# Patient Record
Sex: Female | Born: 1965 | Race: White | Hispanic: No | Marital: Married | Smoking: Former smoker
Health system: Southern US, Community
[De-identification: ages and names within clinical notes are randomized; demographics above are authoritative.]

## PROBLEM LIST (undated history)

## (undated) DIAGNOSIS — F32A Depression, unspecified: Secondary | ICD-10-CM

## (undated) DIAGNOSIS — I87301 Chronic venous hypertension (idiopathic) without complications of right lower extremity: Secondary | ICD-10-CM

## (undated) DIAGNOSIS — M069 Rheumatoid arthritis, unspecified: Secondary | ICD-10-CM

## (undated) DIAGNOSIS — R079 Chest pain, unspecified: Secondary | ICD-10-CM

## (undated) DIAGNOSIS — K5900 Constipation, unspecified: Secondary | ICD-10-CM

## (undated) DIAGNOSIS — F329 Major depressive disorder, single episode, unspecified: Secondary | ICD-10-CM

## (undated) DIAGNOSIS — I83891 Varicose veins of right lower extremities with other complications: Secondary | ICD-10-CM

## (undated) DIAGNOSIS — M549 Dorsalgia, unspecified: Secondary | ICD-10-CM

## (undated) DIAGNOSIS — K219 Gastro-esophageal reflux disease without esophagitis: Secondary | ICD-10-CM

## (undated) DIAGNOSIS — G5600 Carpal tunnel syndrome, unspecified upper limb: Secondary | ICD-10-CM

## (undated) DIAGNOSIS — I8390 Asymptomatic varicose veins of unspecified lower extremity: Secondary | ICD-10-CM

## (undated) DIAGNOSIS — Z8701 Personal history of pneumonia (recurrent): Secondary | ICD-10-CM

## (undated) DIAGNOSIS — M1990 Unspecified osteoarthritis, unspecified site: Secondary | ICD-10-CM

## (undated) DIAGNOSIS — R7303 Prediabetes: Secondary | ICD-10-CM

## (undated) DIAGNOSIS — R600 Localized edema: Secondary | ICD-10-CM

## (undated) DIAGNOSIS — M51369 Other intervertebral disc degeneration, lumbar region without mention of lumbar back pain or lower extremity pain: Secondary | ICD-10-CM

## (undated) DIAGNOSIS — E559 Vitamin D deficiency, unspecified: Secondary | ICD-10-CM

## (undated) DIAGNOSIS — J189 Pneumonia, unspecified organism: Secondary | ICD-10-CM

## (undated) DIAGNOSIS — K829 Disease of gallbladder, unspecified: Secondary | ICD-10-CM

## (undated) DIAGNOSIS — F419 Anxiety disorder, unspecified: Secondary | ICD-10-CM

## (undated) DIAGNOSIS — R0602 Shortness of breath: Secondary | ICD-10-CM

## (undated) DIAGNOSIS — M2550 Pain in unspecified joint: Secondary | ICD-10-CM

## (undated) HISTORY — DX: Personal history of pneumonia (recurrent): Z87.01

## (undated) HISTORY — DX: Rheumatoid arthritis, unspecified: M06.9

## (undated) HISTORY — DX: Dorsalgia, unspecified: M54.9

## (undated) HISTORY — DX: Unspecified osteoarthritis, unspecified site: M19.90

## (undated) HISTORY — DX: Chest pain, unspecified: R07.9

## (undated) HISTORY — DX: Localized edema: R60.0

## (undated) HISTORY — DX: Shortness of breath: R06.02

## (undated) HISTORY — DX: Asymptomatic varicose veins of unspecified lower extremity: I83.90

## (undated) HISTORY — PX: OTHER SURGICAL HISTORY: SHX169

## (undated) HISTORY — PX: CARPAL TUNNEL RELEASE: SHX101

## (undated) HISTORY — PX: DILATION AND CURETTAGE OF UTERUS: SHX78

## (undated) HISTORY — DX: Prediabetes: R73.03

## (undated) HISTORY — DX: Other intervertebral disc degeneration, lumbar region without mention of lumbar back pain or lower extremity pain: M51.369

## (undated) HISTORY — PX: CHOLECYSTECTOMY: SHX55

## (undated) HISTORY — PX: JOINT REPLACEMENT: SHX530

## (undated) HISTORY — DX: Vitamin D deficiency, unspecified: E55.9

## (undated) HISTORY — DX: Disease of gallbladder, unspecified: K82.9

## (undated) HISTORY — DX: Pain in unspecified joint: M25.50

---

## 2006-10-20 ENCOUNTER — Ambulatory Visit (HOSPITAL_COMMUNITY): Admission: RE | Admit: 2006-10-20 | Discharge: 2006-10-20 | Payer: Self-pay | Admitting: Family Medicine

## 2007-10-13 ENCOUNTER — Ambulatory Visit: Payer: Self-pay | Admitting: Vascular Surgery

## 2008-01-19 ENCOUNTER — Ambulatory Visit: Payer: Self-pay | Admitting: Vascular Surgery

## 2008-02-09 ENCOUNTER — Ambulatory Visit: Payer: Self-pay | Admitting: Vascular Surgery

## 2008-02-16 ENCOUNTER — Ambulatory Visit: Payer: Self-pay | Admitting: Vascular Surgery

## 2008-03-31 ENCOUNTER — Ambulatory Visit: Payer: Self-pay | Admitting: Vascular Surgery

## 2008-10-18 ENCOUNTER — Ambulatory Visit: Payer: Self-pay | Admitting: Vascular Surgery

## 2010-07-28 ENCOUNTER — Encounter: Payer: Self-pay | Admitting: Internal Medicine

## 2010-11-19 NOTE — Assessment & Plan Note (Signed)
OFFICE VISIT   Elizabeth Mcclure, Elizabeth Mcclure  DOB:  March 06, 1966                                       10/18/2008  [id number]#:[id number]   The patient presents today for continued discussion regarding leg pain.  She is status post attempted ablation of an incompetent large great  saphenous vein by myself on 02/09/2008.  She had a failed attempt at  ablation in 2004 in [place].  She does have discomfort on both  lower extremities.  This is more so on the right than on the left.  She  reports she senses total leg swelling more so in the right than on the  left.  She has several components of pain.  These are pain in her groin  area and also she reports a sensation of fullness and discomfort in her  posterior thigh especially with sitting.  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   She underwent handheld duplex by myself today and this shows continued  patency of her incompetent right great saphenous vein.  I do not see any  evidence of varicosities in her groin where she is having the pain.  I  had a long discussion with the patient.  I explained that there are some  components of her discomfort that I feel that we could treat with the  correction of her venous reflux in her right great saphenous vein but  that the area of most concern in her groin is not consistent with vein  problems that she is having.  I also explained that some of her swelling  may be improved by ablation.  She is morbidly obese and is considering  gastric banding for weight loss assistance, do feel that this will make  things better.  She reports that she has had prior arthroscopy in her  left knee and is now having new problems and pain in her right knee.  I  explained that this would not be related to the venous pathology she  has.  Since she certainly has multiple components of her pain I am not  sure that we would give her a great amount of benefit  with treatment.  I  did explain that if she wished to proceed with treatment of her  saphenous reflux that I would recommend surgical stripping of her great  saphenous vein.  She has had failure of two attempts with ablation, one  by another practitioner in 2004 and one by myself in 2009.  She is upset  regarding this and somewhat tearful today and reports that she will  continue to consider her options.  She will notify us should she wish to  proceed with any further consideration of surgery.   Larina Earthly, M.D.  Electronically Signed   TFE/MEDQ  D:  10/18/2008  T:  10/19/2008  Job:  [id number]   cc:   Kirstie Peri, MD

## 2010-11-19 NOTE — Assessment & Plan Note (Signed)
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  She has worn compression garments  for 3 months now and continues to have severe difficulty related to  this.  She reports that she continues to have pain with prolonged  standing, makes it extremely difficult secondary to leg pain and  swelling.  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  She  is also unable to bend and squat secondary to leg pain and swelling to  do housework, yard work and childcare.  She is not able to exercise  secondary to leg pain and previously walked and swam regularly as  exercise regimen which she can no longer do.  She has clearly failed  conservative treatment.  She underwent a formal venous Duplex today and  this does reveal reflux throughout her saphenous vein from her  saphenofemoral junction down to below her knee.  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  I have recommended that we proceed with laser  ablation and stab phlebectomy for correction of her venous hypertension.  I explained the procedure is done as an outpatient.  This is in our  office, she is ambulatory immediately following the procedure.  She  understands and wishes to proceed as soon as we can assure insurance  approval for her.  She would like to accomplish this so she can return  to work at the start of school.   Larina Earthly, M.D.  Electronically Signed   TFE/MEDQ  D:  01/19/2008  T:  01/20/2008  Job:  [id number]   cc:   Kirstie Peri, MD

## 2010-11-19 NOTE — Procedures (Signed)
DUPLEX DEEP VENOUS EXAM - LOWER EXTREMITY   INDICATION:  Follow-up evaluation of right greater saphenous vein  ablation.   HISTORY:  Edema:  Yes.  Trauma/Surgery:  Right greater saphenous vein ablation on 02/09/08.  Pain:  Yes.  PE:  No.  Previous DVT:  No.  Anticoagulants:  No.  Other:   DUPLEX EXAM:                CFV   SFV   PopV  PTV    GSV                R  L  R  L  R  L  R   L  R  L  Thrombosis    o                        P  Spontaneous   +                        +  Phasic        +                        +  Augmentation  +                        D  Compressible  +                        P  Competent     0                        0   Legend:  + - yes  o - no  p - partial  D - decreased    IMPRESSION:  1. Limited deep venous thrombosis study due to patient's body habitus.  2. Reflux is noted in the common femoral vein and in the greater      saphenous vein.  3. The greater saphenous vein is fully compressible at the      saphenofemoral junction and just distally and is partially      thrombosed just distally at the level of the proximal thigh.       _____________________________  Larina Earthly, M.D.   PB/MEDQ  D:  02/16/2008  T:  02/16/2008  Job:  [id number]

## 2010-11-19 NOTE — Assessment & Plan Note (Signed)
OFFICE VISIT   Elizabeth Mcclure, Elizabeth Mcclure  DOB:  September 02, 1965                                       02/16/2008  [id number]#:[id number]   The patient presents today 1 week followup right leg laser ablation of  her saphenous vein from just below knee to her groin and stab  phlebectomy of tributary varicosities in her right calf.  She does  report the usual amount of erythema and soreness around the phlebectomy  sites and also the medial calf.  She was able to work on Saturday night,  concessions, without any difficulty.   PHYSICAL EXAMINATION:  Her Steri-Strips are all intact.  She has no  evidence of infection.  She does have some erythema around the  phlebectomy sites at her medial proximal calf.   She underwent venous duplex today and this shows no evidence of DVT,  somewhat difficult due to her large size.  She did have a partial  occlusion of her saphenous vein with some flow in her proximal  saphenofemoral junction.  I have reviewed this with the patient.  We  will plan to see her again in 6 weeks with repeat duplex that time to  follow resolution of her ablation.  In the meantime, she will proceed  with her usual activities without limitation.   Larina Earthly, M.D.  Electronically Signed   TFE/MEDQ  D:  02/16/2008  T:  02/17/2008  Job:  [id number]

## 2010-11-19 NOTE — Procedures (Signed)
LOWER EXTREMITY VENOUS REFLUX EXAM   INDICATION:  Followup right greater saphenous vein ablation with pain  and swelling.   EXAM:  Using color-flow imaging and pulse Doppler spectral analysis, the  right common femoral, superficial femoral, popliteal, posterior tibial,  greater and lesser saphenous veins are evaluated.  There is evidence  suggesting deep venous insufficiency in the right lower extremity.   The right saphenofemoral junction is not competent.  The right GSV is  not competent.   The right proximal short saphenous vein demonstrates competency.   GSV Diameter (used if found to be incompetent only)                                            Right    Left  Proximal Greater Saphenous Vein           1.51 cm  cm  Proximal-to-mid-thigh                     1.51 cm  cm  Mid thigh                                 1.19 cm  cm  Mid-distal thigh                          1.19 cm  cm  Distal thigh                              1.00 cm  cm  Knee                                      cm       cm   IMPRESSION:  1. Right greater saphenous vein reflux is identified with the caliber      ranging from 1.00 cm to 1.51 cm knee to groin.  2. The right greater saphenous vein is not aneurysmal.  3. The right greater saphenous vein is not tortuous.  4. The deep venous system is not competent.  5. The right lesser saphenous vein is competent.  6. No evidence of DVT noted in the right leg.   ___________________________________________  Larina Earthly, M.D.   MG/MEDQ  D:  03/31/2008  T:  04/01/2008  Job:  [id number]

## 2010-11-19 NOTE — Consult Note (Signed)
NEW PATIENT CONSULTATION   ALYSON, KI  DOB:  1965/07/30                                       10/13/2007  [id number]#:[id number]   The patient presents today for evaluation of compressive pain in her  right leg and right venous varicosities.  She is a 45 year old white  female with a long history of progressive venous varicosities.  She has  had recent re-flareup of these with severe pain in the plexus of  varicosities in her right posterior calf.  She does not have any history  of deep venous thrombosis or superficial thrombophlebitis or bleeding.  She does have chronic swelling and pain related to this.   PAST MEDICAL HISTORY:  Significant for prior history of anxiety attacks.  She does have some early hypertension which is not treated.  She did  undergo what sounds like attempted ablation of her saphenous vein by  outlying vein clinic in [place] in 2004.  She had brief  improvement of the posterior calf symptoms but then had a recurrence and  this has been progressive over time.   SOCIAL HISTORY:  It is noted she is married with 2 children.  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  She does not smoke, having  quit 5 years ago and does not drink alcohol.   REVIEW OF SYSTEMS:  Positive for shortness of breath with exertion,  gastroesophageal reflux, arthritic and joint pain.   MEDICATION ALLERGIES:  Benadryl and amoxicillin.   CURRENT MEDICATIONS:  Proventil.   PHYSICAL EXAMINATION:  The patient is a well-developed, obese white  female in no acute distress.  Her dorsalis pedis pulses are 2+  bilaterally.  She has 2+ radial pulses.  She does have marked tight  saphenous varicosities in her right posterior calf.   She underwent venous duplex by me in the office and this revealed large  saphenous vein in the right thigh with no evidence of closure with gross  reflux.  I discussed this with the patient.  I explained that she had a  failure of prior attempt at an outlying center to occlude her saphenous  vein.  I explained that she has continued venous hypertension.  She has  worn nonprescription grade graduated compression stockings and we have  fitted her with 20-30 mm graduated compression stockings today.  We will  see her again in 3 months with a formal duplex and will discuss this  further with her.  I suspect that she will be an excellent candidate for  laser ablation and stab phlebectomy for relief of her symptoms.   Larina Earthly, M.D.  Electronically Signed   TFE/MEDQ  D:  10/13/2007  T:  10/14/2007  Job:  [id number]   cc:   Kirstie Peri, MD

## 2010-11-19 NOTE — Procedures (Signed)
LOWER EXTREMITY VENOUS REFLUX EXAM   INDICATION:  Right lower extremity varicose vein, right greater  saphenous vein ablation in 2004.   EXAM:  Using color-flow imaging and pulse Doppler spectral analysis, the  right common femoral, superficial femoral, popliteal, posterior tibial,  greater and lesser saphenous veins are evaluated.  There is evidence  suggesting deep venous insufficiency in the right lower extremity.   The right saphenofemoral junction is not competent.  The right GSV is  not competent with the caliber as described below.   The right proximal short saphenous vein demonstrates competency.   GSV Diameter (used if found to be incompetent only)                                            Right    Left  Proximal Greater Saphenous Vein           1.9 cm   cm  Proximal-to-mid-thigh                     1.0 cm   cm  Mid thigh                                 0.8 cm   cm  Mid-distal thigh                          1.0 cm   cm  Distal thigh                              1.0 cm   cm  Knee                                      1.9 cm   cm   IMPRESSION:  1. Right greater saphenous vein reflux is identified with the caliber      ranging from 1.9 cm to 0.8 cm knee to groin.  2. The right greater saphenous vein is dilated to 1.9 cm in diameter      at the saphenofemoral junction and just below the knee at an      incompetent perforator.  3. The right greater saphenous vein is not tortuous.  4. The right common femoral vein is incompetent.  The remaining deep      system of veins in the right leg is competent.  5. The right lesser saphenous vein is competent.   ___________________________________________  Larina Earthly, M.D.   MC/MEDQ  D:  01/19/2008  T:  01/19/2008  Job:  [id number]

## 2010-11-19 NOTE — Assessment & Plan Note (Signed)
OFFICE VISIT   JOURDEN, DELMONT  DOB:  07/08/1965                                       03/31/2008  [id number]#:[id number]   The patient presents today for continued followup of her lower extremity  swelling and pain.  She has had progressive swelling and difficulty in  both legs since our last visit with her.  She reports now that she had  similar discomfort with marked swelling in her right leg and actually  presented to the emergency department related to this.  On ultrasound  today she had unsuccessful closure of her right great saphenous vein  with her ablation on 02/09/2008.  She continues to have flow through  this.  Interestingly, she has had a prior failure of ablation treatment  in [place] prior to this in the same leg.  I discussed with the  patient and explained that this is extremely unusual.  I am also  concerned that she is having progressive swelling in both legs and does  not have a demonstrable reflux on the left leg.  She does have marked  edema bilaterally.  She is morbidly obese.  She is currently not on any  diuretic therapy.  I would consider aggressive diuresis due to her  progressive swelling.  She will discuss this with Dr. Kirstie Peri at her  next visit with him.  I will see her again in 2 months for further  discussion.  I explained that she would be a candidate for another  attempt at ablation of her saphenous vein, which could hopefully improve  swelling on her right leg but would not make any impact of the left.  She will continue her medical evaluation and we will see her in 2  months.   Larina Earthly, M.D.  Electronically Signed   TFE/MEDQ  D:  03/31/2008  T:  04/03/2008  Job:  [id number]   cc:   Kirstie Peri, MD

## 2012-05-10 ENCOUNTER — Ambulatory Visit (INDEPENDENT_AMBULATORY_CARE_PROVIDER_SITE_OTHER): Payer: BC Managed Care – PPO | Admitting: Gastroenterology

## 2012-05-10 ENCOUNTER — Encounter: Payer: Self-pay | Admitting: Gastroenterology

## 2012-05-10 VITALS — BP 124/82 | HR 75 | Temp 97.0°F | Ht 67.0 in | Wt 279.8 lb

## 2012-05-10 DIAGNOSIS — R590 Localized enlarged lymph nodes: Secondary | ICD-10-CM

## 2012-05-10 DIAGNOSIS — R599 Enlarged lymph nodes, unspecified: Secondary | ICD-10-CM

## 2012-05-10 DIAGNOSIS — Z1211 Encounter for screening for malignant neoplasm of colon: Secondary | ICD-10-CM

## 2012-05-10 MED ORDER — PEG 3350-KCL-NA BICARB-NACL 420 G PO SOLR: 4000.0000 mL | ORAL | Status: DC

## 2012-05-10 NOTE — Progress Notes (Signed)
Referring Provider: Kirstie Peri, MD Primary Care Physician:  Kirstie Peri, MD Primary Gastroenterologist:  Dr. Darrick Penna   Chief Complaint  Patient presents with  . Constipation  . Colonoscopy    Family hx of colon cancer    HPI:   Elizabeth Mcclure presents today as a consult for colonoscopy at Dr. Margaretmary Eddy request. Her sister was recently seen by our office and set up for screening colonoscopy. Elizabeth Mcclure notes a +FH of colon cancer, with her mom passing away the day after Mother's Day this year. Age diagnosed 83. As a side note, Elizabeth Mcclure joined [employer] in January of this year and has lost 100+ lbs with changing eating habits. She has noticed a change in bowel habits, trending towards constipation. She believes this is due to absence of fatty, greasy foods, which caused loose stools in the past.  Notes mild hematochezia in the past, which has now resolved. Taking [facility] health. Denies nausea, abdominal pain, reflux, dysphagia.    Past Medical History  Diagnosis Date  . Joint pain     Past Surgical History  Procedure Date  . Vein closure     X2, needs vein stripping  . Left knee arthroscopy     Current Outpatient Prescriptions  Medication Sig Dispense Refill  . cetirizine (ZYRTEC) 10 MG tablet Take 10 mg by mouth daily.      . naproxen sodium (ANAPROX) 220 MG tablet Take 220 mg by mouth 2 (two) times daily with a meal. Takes one to two tablets daily for bone spurs      . polyethylene glycol-electrolytes (TRILYTE) 420 G solution Take 4,000 mLs by mouth as directed.  4000 mL  0    Allergies as of 05/10/2012 - Review Complete 05/10/2012  Allergen Reaction Noted  . Benadryl (diphenhydramine) Hives 05/10/2012  . Penicillins  05/10/2012    Family History  Problem Relation Age of Onset  . Colon cancer Mother 72    passed away this year, Nov 24, 2011, day after Mother's day.     History   Social History  . Marital Status: Married    Spouse Name: N/A    Number of  Children: N/A  . Years of Education: N/A   Occupational History  . Not on file.   Social History Main Topics  . Smoking status: Former Games developer  . Smokeless tobacco: Not on file     Comment: Quit smoking x 13 years  . Alcohol Use: Yes     Comment: 2-3 drinks a year  . Drug Use: No  . Sexually Active: Yes -- Female partner(s)    Birth Control/ Protection: None   Other Topics Concern  . Not on file   Social History Narrative  . No narrative on file    Review of Systems: Gen: Denies any fever, chills, loss of appetite, fatigue, weight loss. CV: Denies chest pain, heart palpitations, syncope, peripheral edema. Resp: Denies shortness of breath with rest, cough, wheezing GI: Denies dysphagia or odynophagia. Denies hematemesis, fecal incontinence, or jaundice.  GU : Denies urinary burning, urinary frequency, urinary incontinence.  MS: Denies joint pain, muscle weakness, cramps, limited movement Derm: Denies rash, itching, dry skin Psych: Denies depression, anxiety, confusion or memory loss  Heme: Denies bruising, bleeding, and enlarged lymph nodes.  Physical Exam: BP 124/82  Pulse 75  Temp 97 F (36.1 C) (Tympanic)  Ht 5\' 7"  (1.702 m)  Wt 279 lb 12.8 oz (126.916 kg)  BMI 43.82 kg/m2  LMP 05/09/2012 General:   Alert  and oriented. Well-developed, well-nourished, pleasant and cooperative. Head:  Normocephalic and atraumatic. Eyes:  Conjunctiva pink, sclera clear, no icterus.   Conjunctiva pink. Ears:  Normal auditory acuity. Nose:  No deformity, discharge,  or lesions. Mouth:  No deformity or lesions, mucosa pink and moist.  Neck:  Left submandibular adenopathy, pt notes "aware" of this, sometimes uncomfortable. Lungs:  Clear to auscultation bilaterally, without wheezing, rales, or rhonchi.  Heart:  S1, S2 present without murmurs noted.  Abdomen:  +BS, soft, non-tender and non-distended. Without mass or HSM. No rebound or guarding. No hernias noted. Rectal:  Deferred  Msk:   Symmetrical without gross deformities. Normal posture. Extremities:  Without clubbing or edema. Neurologic:  Alert and  oriented x4;  grossly normal neurologically. Skin:  Intact, warm and dry without significant lesions or rashes Psych:  Alert and cooperative. Normal mood and affect.

## 2012-05-10 NOTE — Patient Instructions (Addendum)
We have set you up for a colonoscopy with Dr. Darrick Penna in the near future.  I have also ordered an ultrasound of your neck to further evaluate. We will call you with those results.

## 2012-05-12 ENCOUNTER — Other Ambulatory Visit: Payer: Self-pay | Admitting: Gastroenterology

## 2012-05-13 ENCOUNTER — Encounter: Payer: Self-pay | Admitting: Gastroenterology

## 2012-05-13 DIAGNOSIS — R590 Localized enlarged lymph nodes: Secondary | ICD-10-CM | POA: Insufficient documentation

## 2012-05-13 DIAGNOSIS — Z1211 Encounter for screening for malignant neoplasm of colon: Secondary | ICD-10-CM | POA: Insufficient documentation

## 2012-05-13 NOTE — Progress Notes (Unsigned)
Needs urine pregnancy screen prior to TCS. I didn't order this at time of visit. Thanks!

## 2012-05-13 NOTE — Progress Notes (Signed)
Faxed to PCP

## 2012-05-13 NOTE — Assessment & Plan Note (Signed)
Left submandibular adenopathy, with pt stating she is "aware" of this for some time. [place] of neck. Further recommendations once completed.

## 2012-05-13 NOTE — Assessment & Plan Note (Signed)
46 year old female with hx of scant hematochezia in the past, likely benign anorectal source. However, recent passing of mother secondary to colon cancer, age 83. Needs lower GI evaluation due to family hx, brbpr.  Proceed with colonoscopy with Dr. Darrick Penna in the near future. The risks, benefits, and alternatives have been discussed in detail with the patient. They state understanding and desire to proceed.  NEEDS URINE PREGNANCY SCREEN due to child-bearing age

## 2012-05-17 ENCOUNTER — Other Ambulatory Visit: Payer: Self-pay

## 2012-05-17 ENCOUNTER — Encounter (HOSPITAL_COMMUNITY): Payer: Self-pay | Admitting: Pharmacy Technician

## 2012-05-17 DIAGNOSIS — Z139 Encounter for screening, unspecified: Secondary | ICD-10-CM

## 2012-05-17 NOTE — Progress Notes (Signed)
Pt is aware and order was faxed to [facility] per her request. I told her to let me know if that is a problem.

## 2012-05-18 ENCOUNTER — Telehealth: Payer: Self-pay

## 2012-05-18 NOTE — Telephone Encounter (Signed)
Called pt to see if she had her urine pregnancy done at [facility] . LM for a return call.

## 2012-05-19 NOTE — Telephone Encounter (Signed)
Please tell pt her [place] of neck was received. Lymph nodes with normal appearance.  Recommend f/u with PCP if further issues or feels like it is increasing in size.

## 2012-05-19 NOTE — Progress Notes (Addendum)
Received [place] of head/neck from [facility] due to cervical adenopathy.  Impression: 2 small lymph nodes within left neck inferior to mandible, normal sonographic appearance. Recommends repeat ultrasound if lymph nodes continue to increase in size.

## 2012-05-20 ENCOUNTER — Telehealth: Payer: Self-pay | Admitting: Gastroenterology

## 2012-05-20 NOTE — Telephone Encounter (Signed)
Pt had LMOM. She was returning call from earlier and asked if we could call her on her cell phone at [phone] and she can be reached after 4pm on [phone]

## 2012-05-20 NOTE — Telephone Encounter (Signed)
Spoke to pt at 8:55 Am today.

## 2012-05-20 NOTE — Telephone Encounter (Signed)
Called and informed pt. She said she is not having any problems with her neck, she will follow up if she does.

## 2012-05-21 ENCOUNTER — Telehealth: Payer: Self-pay

## 2012-05-21 NOTE — Telephone Encounter (Signed)
Noted.  Will fwd to AS.

## 2012-05-21 NOTE — Telephone Encounter (Signed)
[facility] to request results of the urine pregnancy. They did a HCG instead.   Result <1. Routing to Lorenza Burton, NP to sign off on since Gerrit Halls, NP is off today. ( Copy of report on Kandice's desk). Pt is scheduled for her colonoscopy on 05/24/2012.

## 2012-05-24 ENCOUNTER — Encounter (HOSPITAL_COMMUNITY)
Admission: RE | Disposition: A | Discharge status: Home / Self Care | Payer: Self-pay | Source: Ambulatory Visit | Attending: Gastroenterology

## 2012-05-24 ENCOUNTER — Encounter (HOSPITAL_COMMUNITY): Payer: Self-pay | Admitting: *Deleted

## 2012-05-24 ENCOUNTER — Ambulatory Visit (HOSPITAL_COMMUNITY)
Admission: RE | Admit: 2012-05-24 | Discharge: 2012-05-24 | Disposition: A | Discharge status: Home / Self Care | Payer: BC Managed Care – PPO | Source: Ambulatory Visit | Attending: Gastroenterology | Admitting: Gastroenterology

## 2012-05-24 DIAGNOSIS — Z800 Family history of malignant neoplasm of digestive organs: Secondary | ICD-10-CM | POA: Insufficient documentation

## 2012-05-24 DIAGNOSIS — D126 Benign neoplasm of colon, unspecified: Secondary | ICD-10-CM

## 2012-05-24 DIAGNOSIS — Z8709 Personal history of other diseases of the respiratory system: Secondary | ICD-10-CM

## 2012-05-24 DIAGNOSIS — Z1211 Encounter for screening for malignant neoplasm of colon: Secondary | ICD-10-CM

## 2012-05-24 DIAGNOSIS — F17200 Nicotine dependence, unspecified, uncomplicated: Secondary | ICD-10-CM | POA: Insufficient documentation

## 2012-05-24 DIAGNOSIS — K648 Other hemorrhoids: Secondary | ICD-10-CM | POA: Insufficient documentation

## 2012-05-24 DIAGNOSIS — D128 Benign neoplasm of rectum: Secondary | ICD-10-CM | POA: Insufficient documentation

## 2012-05-24 DIAGNOSIS — K5730 Diverticulosis of large intestine without perforation or abscess without bleeding: Secondary | ICD-10-CM | POA: Insufficient documentation

## 2012-05-24 DIAGNOSIS — D129 Benign neoplasm of anus and anal canal: Secondary | ICD-10-CM | POA: Insufficient documentation

## 2012-05-24 HISTORY — DX: Constipation, unspecified: K59.00

## 2012-05-24 HISTORY — DX: Carpal tunnel syndrome, unspecified upper limb: G56.00

## 2012-05-24 HISTORY — PX: COLONOSCOPY: SHX5424

## 2012-05-24 SURGERY — COLONOSCOPY
Anesthesia: Moderate Sedation

## 2012-05-24 MED ORDER — MIDAZOLAM HCL 5 MG/5ML IJ SOLN
INTRAMUSCULAR | Status: AC
  Filled 2012-05-24: qty 10

## 2012-05-24 MED ORDER — MIDAZOLAM HCL 5 MG/5ML IJ SOLN
INTRAMUSCULAR | Status: DC | PRN
  Administered 2012-05-24: 2 mg via INTRAVENOUS
  Administered 2012-05-24: 1 mg via INTRAVENOUS
  Administered 2012-05-24: 2 mg via INTRAVENOUS

## 2012-05-24 MED ORDER — MEPERIDINE HCL 100 MG/ML IJ SOLN
INTRAMUSCULAR | Status: DC | PRN
  Administered 2012-05-24: 25 mg via INTRAVENOUS
  Administered 2012-05-24: 50 mg via INTRAVENOUS
  Administered 2012-05-24: 25 mg via INTRAVENOUS

## 2012-05-24 MED ORDER — SODIUM CHLORIDE 0.45 % IV SOLN
INTRAVENOUS | Status: DC
  Administered 2012-05-24: 09:00:00 via INTRAVENOUS

## 2012-05-24 MED ORDER — MEPERIDINE HCL 100 MG/ML IJ SOLN
INTRAMUSCULAR | Status: AC
  Filled 2012-05-24: qty 1

## 2012-05-24 MED ORDER — STERILE WATER FOR IRRIGATION IR SOLN
Status: DC | PRN
  Administered 2012-05-24: 10:00:00

## 2012-05-24 NOTE — H&P (Signed)
  Primary Care Physician:  Kirstie Peri, MD Primary Gastroenterologist:  Dr. Darrick Penna  Pre-Procedure History & Physical: HPI:  AIVAH PUTMAN is a 46 y.o. female here for FAMILY Hx COLON CA-mother HAD COLON CA AGE > 60.   Past Medical History  Diagnosis Date  . Joint pain   . Carpal tunnel syndrome   . Constipation     Past Surgical History  Procedure Date  . Vein closure     X2, needs vein stripping  . Left knee arthroscopy     Prior to Admission medications   Medication Sig Start Date End Date Taking? Authorizing Provider  cetirizine (ZYRTEC) 10 MG tablet Take 10 mg by mouth daily.   Yes Historical Provider, MD  GARCINIA CAMBOGIA-CHROMIUM PO Take 1 tablet by mouth daily.   Yes Historical Provider, MD  Chilton Si Coffee Bean-Yerba Mate (GREEN COFFEE BEAN EXTRACT PO) Take 1 tablet by mouth daily.   Yes Historical Provider, MD  naproxen sodium (ALEVE) 220 MG tablet Take 220-440 mg by mouth 2 (two) times daily as needed.   Yes Historical Provider, MD    Allergies as of 05/10/2012 - Review Complete 05/10/2012  Allergen Reaction Noted  . Benadryl (diphenhydramine) Hives 05/10/2012  . Penicillins  05/10/2012    Family History  Problem Relation Age of Onset  . Colon cancer Mother 71    passed away this year, Dec 06, 2011, day after Mother's day.     History   Social History  . Marital Status: Married    Spouse Name: N/A    Number of Children: N/A  . Years of Education: N/A   Occupational History  . Not on file.   Social History Main Topics  . Smoking status: Current Every Day Smoker -- 0.2 packs/day for 10.5 years    Types: Cigarettes  . Smokeless tobacco: Not on file     Comment: Quit smoking x 13 years  . Alcohol Use: Yes     Comment: 2-3 drinks a year  . Drug Use: No  . Sexually Active: Yes -- Female partner(s)    Birth Control/ Protection: None   Other Topics Concern  . Not on file   Social History Narrative  . No narrative on file    Review of Systems: See HPI,  otherwise negative ROS   Physical Exam: BP 131/82  Pulse 70  Temp 97.7 F (36.5 C) (Oral)  Resp 20  Ht 5\' 7"  (1.702 m)  Wt 279 lb (126.554 kg)  BMI 43.70 kg/m2  SpO2 100%  LMP 05/09/2012 General:   Alert,  pleasant and cooperative in NAD Head:  Normocephalic and atraumatic. Neck:  Supple; Lungs:  Clear throughout to auscultation.    Heart:  Regular rate and rhythm. Abdomen:  Soft, nontender and nondistended. Normal bowel sounds, without guarding, and without rebound.   Neurologic:  Alert and  oriented x4;  grossly normal neurologically.  Impression/Plan:    FAMILY Hx COLON CA-mother HAD COLON CA AGE > 60.  SCREENING  Plan:  1. TCS TODAY

## 2012-05-24 NOTE — Telephone Encounter (Signed)
Noted  

## 2012-05-24 NOTE — Op Note (Signed)
[facility] [address] [place], [address]   COLONOSCOPY PROCEDURE REPORT  PATIENT: Elizabeth Mcclure, Elizabeth Mcclure  MR#: [id number] BIRTHDATE: 1965-09-20 , 45  yrs. old GENDER: Female ENDOSCOPIST: Jonette Eva, MD REFERRED IO:NGEXBM Sherryll Burger, M.D. PROCEDURE DATE:  05/24/2012 PROCEDURE:   Colonoscopy with snare polypectomy INDICATIONS:patient's immediate family history of colon cancer. MEDICATIONS: Demerol 100 mg IV and Versed 5 mg IV  DESCRIPTION OF PROCEDURE:    Physical exam was performed.  Informed consent was obtained from the patient after explaining the benefits, risks, and alternatives to procedure.  The patient was connected to monitor and placed in left lateral position. Continuous oxygen was provided by nasal cannula and IV medicine administered through an indwelling cannula.  After administration of sedation and rectal exam, the patients rectum was intubated and the Pentax Colonoscope [phone]  colonoscope was advanced under direct visualization to the ileum.  The scope was removed slowly by carefully examining the color, texture, anatomy, and integrity mucosa on the way out.  The patient was recovered in endoscopy and discharged home in satisfactory condition.       COLON FINDINGS: The mucosa appeared normal in the terminal ileum.  , A sessile polyp measuring 6 mm in size was found in the rectum.  A polypectomy was performed using snare cautery.  , Moderate diverticulosis was noted in the descending colon and sigmoid colon. , The colon mucosa was otherwise normal.  , and Moderate sized internal hemorrhoids were found.  PREP QUALITY: excellent. CECAL W/D TIME: 14 minutes  COMPLICATIONS: None  ENDOSCOPIC IMPRESSION: 1.   Normal mucosa in the terminal ileum 2.   Sessile polyp measuring 6 mm in size was found in the rectum; polypectomy was performed using snare cautery 3.   Moderate diverticulosis was noted in the descending colon and sigmoid colon 4.   The  colon mucosa was otherwise normal 5.   Moderate sized internal hemorrhoids   RECOMMENDATIONS: AWAIT BIOPSY HIGH FIBER DIET TCS IN 10 YEARS       _______________________________ Rosalie DoctorJonette Eva, MD 05/24/2012 10:18 AM     PATIENT NAME:  Elizabeth Mcclure, Elizabeth Mcclure MR#: [id number]

## 2012-05-26 ENCOUNTER — Encounter (HOSPITAL_COMMUNITY): Payer: Self-pay | Admitting: Gastroenterology

## 2012-06-01 ENCOUNTER — Telehealth: Payer: Self-pay | Admitting: Gastroenterology

## 2012-06-01 NOTE — Telephone Encounter (Signed)
Tried to call but the phone number would not work

## 2012-06-01 NOTE — Telephone Encounter (Signed)
Path faxed to PCP, recall made 

## 2012-06-01 NOTE — Telephone Encounter (Signed)
Letter mailed for pt to call for results.  

## 2012-06-01 NOTE — Telephone Encounter (Signed)
Please call pt. She had a polypoid lesion, removed and it was benign. TCS in 10 years. High fiber diet.

## 2012-06-02 ENCOUNTER — Telehealth: Payer: Self-pay | Admitting: *Deleted

## 2012-06-02 NOTE — Telephone Encounter (Signed)
Ms Schroepfer called today to find out the results of her recent test. Please call her back. Thanks.

## 2012-06-02 NOTE — Telephone Encounter (Signed)
Pt is aware of results. 

## 2012-07-15 NOTE — Progress Notes (Signed)
REVIEWED.  TCS NOV 2013 BENIGN POLYPOID LESION

## 2012-12-01 ENCOUNTER — Other Ambulatory Visit: Payer: Self-pay | Admitting: *Deleted

## 2012-12-01 DIAGNOSIS — M79609 Pain in unspecified limb: Secondary | ICD-10-CM

## 2013-01-10 ENCOUNTER — Encounter: Payer: Self-pay | Admitting: Vascular Surgery

## 2013-01-11 ENCOUNTER — Encounter: Payer: BC Managed Care – PPO | Admitting: Vascular Surgery

## 2014-11-07 ENCOUNTER — Emergency Department (HOSPITAL_COMMUNITY)
Admission: EM | Admit: 2014-11-07 | Discharge: 2014-11-07 | Disposition: A | Discharge status: Home / Self Care | Payer: BC Managed Care – PPO | Attending: Emergency Medicine | Admitting: Emergency Medicine

## 2014-11-07 ENCOUNTER — Encounter (HOSPITAL_COMMUNITY): Payer: Self-pay | Admitting: Emergency Medicine

## 2014-11-07 DIAGNOSIS — Y990 Civilian activity done for income or pay: Secondary | ICD-10-CM | POA: Insufficient documentation

## 2014-11-07 DIAGNOSIS — Z79899 Other long term (current) drug therapy: Secondary | ICD-10-CM | POA: Diagnosis not present

## 2014-11-07 DIAGNOSIS — Z8669 Personal history of other diseases of the nervous system and sense organs: Secondary | ICD-10-CM | POA: Insufficient documentation

## 2014-11-07 DIAGNOSIS — Y9389 Activity, other specified: Secondary | ICD-10-CM | POA: Diagnosis not present

## 2014-11-07 DIAGNOSIS — Y92218 Other school as the place of occurrence of the external cause: Secondary | ICD-10-CM | POA: Insufficient documentation

## 2014-11-07 DIAGNOSIS — Z8719 Personal history of other diseases of the digestive system: Secondary | ICD-10-CM | POA: Insufficient documentation

## 2014-11-07 DIAGNOSIS — W010XXA Fall on same level from slipping, tripping and stumbling without subsequent striking against object, initial encounter: Secondary | ICD-10-CM | POA: Diagnosis not present

## 2014-11-07 DIAGNOSIS — Z720 Tobacco use: Secondary | ICD-10-CM | POA: Diagnosis not present

## 2014-11-07 DIAGNOSIS — G8929 Other chronic pain: Secondary | ICD-10-CM | POA: Insufficient documentation

## 2014-11-07 DIAGNOSIS — S8992XA Unspecified injury of left lower leg, initial encounter: Secondary | ICD-10-CM | POA: Diagnosis present

## 2014-11-07 DIAGNOSIS — Z880 Allergy status to penicillin: Secondary | ICD-10-CM | POA: Diagnosis not present

## 2014-11-07 DIAGNOSIS — M25562 Pain in left knee: Secondary | ICD-10-CM

## 2014-11-07 MED ORDER — HYDROCODONE-ACETAMINOPHEN 5-325 MG PO TABS: 1.0000 | ORAL_TABLET | Freq: Four times a day (QID) | ORAL | Status: DC | PRN

## 2014-11-07 MED ORDER — OXYCODONE-ACETAMINOPHEN 5-325 MG PO TABS
2.0000 | ORAL_TABLET | Freq: Once | ORAL | Status: AC
  Administered 2014-11-07: 2 via ORAL
  Filled 2014-11-07: qty 2

## 2014-11-07 NOTE — Discharge Instructions (Signed)
1. Medications: vicodin for pain, usual home medications 2. Treatment: rest, drink plenty of fluids, use ACE bandage 3. Follow Up: Please followup with your  days for discussion of your diagnoses and further evaluation after today's visit; if you do not have a primary care doctor use the resource guide provided to find one; Please return to the ER for worsening symptoms    Arthralgia Your caregiver has diagnosed you as suffering from an arthralgia. Arthralgia means there is pain in a joint. This can come from many reasons including:  Bruising the joint which causes soreness (inflammation) in the joint.  Wear and tear on the joints which occur as we grow older (osteoarthritis).  Overusing the joint.  Various forms of arthritis.  Infections of the joint. Regardless of the cause of pain in your joint, most of these different pains respond to anti-inflammatory drugs and rest. The exception to this is when a joint is infected, and these cases are treated with antibiotics, if it is a bacterial infection. HOME CARE INSTRUCTIONS   Rest the injured area for as long as directed by your caregiver. Then slowly start using the joint as directed by your caregiver and as the pain allows. Crutches as directed may be useful if the ankles, knees or hips are involved. If the knee was splinted or casted, continue use and care as directed. If an stretchy or elastic wrapping bandage has been applied today, it should be removed and re-applied every 3 to 4 hours. It should not be applied tightly, but firmly enough to keep swelling down. Watch toes and feet for swelling, bluish discoloration, coldness, numbness or excessive pain. If any of these problems (symptoms) occur, remove the ace bandage and re-apply more loosely. If these symptoms persist, contact your caregiver or return to this location.  For the first 24 hours, keep the injured extremity elevated on pillows while lying down.  Apply ice for 15-20 minutes  to the sore joint every couple hours while awake for the first half day. Then 03-04 times per day for the first 48 hours. Put the ice in a plastic bag and place a towel between the bag of ice and your skin.  Wear any splinting, casting, elastic bandage applications, or slings as instructed.  Only take over-the-counter or prescription medicines for pain, discomfort, or fever as directed by your caregiver. Do not use aspirin immediately after the injury unless instructed by your physician. Aspirin can cause increased bleeding and bruising of the tissues.  If you were given crutches, continue to use them as instructed and do not resume weight bearing on the sore joint until instructed. Persistent pain and inability to use the sore joint as directed for more than 2 to 3 days are warning signs indicating that you should see a caregiver for a follow-up visit as soon as possible. Initially, a hairline fracture (break in bone) may not be evident on X-rays. Persistent pain and swelling indicate that further evaluation, non-weight bearing or use of the joint (use of crutches or slings as instructed), or further X-rays are indicated. X-rays may sometimes not show a small fracture until a week or 10 days later. Make a follow-up appointment with your own caregiver or one to whom we have referred you. A radiologist (specialist in reading X-rays) may read your X-rays. Make sure you know how you are to obtain your X-ray results. Do not assume everything is normal if you do not hear from [place]. SEEK MEDICAL CARE IF: Bruising, swelling, or  pain increases. SEEK IMMEDIATE MEDICAL CARE IF:   Your fingers or toes are numb or blue.  The pain is not responding to medications and continues to stay the same or get worse.  The pain in your joint becomes severe.  You develop a fever over 102 F (38.9 C).  It becomes impossible to move or use the joint. MAKE SURE YOU:   Understand these instructions.  Will watch your  condition.  Will get help right away if you are not doing well or get worse. Document Released: 06/23/2005 Document Revised: 09/15/2011 Document Reviewed: 02/09/2008 [facility] Patient Information 2015 [place], [place]. This information is not intended to replace advice given to you by your health care provider. Make sure you discuss any questions you have with your health care provider.

## 2014-11-07 NOTE — ED Provider Notes (Signed)
CSN: [id number]     Arrival date & time 11/07/14  1556 History  This chart was scribed for non-physician practitioner, Abigail Butts, PA-C working with Quintella Reichert, MD by Tula Nakayama, ED scribe. This patient was seen in room TR07C/TR07C and the patient's care was started at 5:11 PM    Chief Complaint  Patient presents with  . Fall   The history is provided by the patient. No language interpreter was used.   HPI Comments: Elizabeth Mcclure is a 49 y.o. female who presents to the Emergency Department complaining of constant, acute-on-chronic episode of moderate left knee pain that became worse today after a fall. She states pain becomes worse with bearing weight and extension of her left leg. She tried Tylenol PTA with no relief. 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 She caught herself with her hands on a table and did not land on her left knee or twist her right leg. Pt reports she has been putting more weight on her right knee since the onset of left knee pain and this has caused discomfort of the right leg. Pt is currently followed by an orthopedist for her chronic knee pain and was previously on light-to-no duty. She reports that he applied Synvisc 3 weeks ago before she returned to work full time, but that pain became worse after Synvisc treatment. She has a follow-up appointment in 3 days. Pt denies numbness, weakness, tingling.  Past Medical History  Diagnosis Date  . Joint pain   . Carpal tunnel syndrome   . Constipation    Past Surgical History  Procedure Laterality Date  . Vein closure      X2, needs vein stripping  . Left knee arthroscopy    . Colonoscopy  05/24/2012    Procedure: COLONOSCOPY;  Surgeon: Danie Binder, MD;  Location: [facility];  Service: Endoscopy;  Laterality: N/A;  9:30  . Cholecystectomy    . Dilation and curettage of uterus     Family History  Problem Relation Age of Onset  .  Colon cancer Mother 60    passed away this year, 10/18/2011, day after Mother's day.    History  Substance Use Topics  . Smoking status: Current Every Day Smoker -- 0.25 packs/day for 10.5 years    Types: Cigarettes  . Smokeless tobacco: Not on file     Comment: Quit smoking x 13 years  . Alcohol Use: Yes     Comment: 2-3 drinks a year   OB History    No data available     Review of Systems  Constitutional: Negative for fever and chills.  Gastrointestinal: Negative for nausea and vomiting.  Musculoskeletal: Positive for joint swelling and arthralgias. Negative for back pain, neck pain and neck stiffness.  Skin: Negative for wound.  Neurological: Negative for numbness.  Hematological: Does not bruise/bleed easily.  Psychiatric/Behavioral: The patient is not nervous/anxious.   All other systems reviewed and are negative.    Allergies  Benadryl and Penicillins  Home Medications   Prior to Admission medications   Medication Sig Start Date End Date Taking? Authorizing Provider  cetirizine (ZYRTEC) 10 MG tablet Take 10 mg by mouth daily.    Historical Provider, MD  GARCINIA CAMBOGIA-CHROMIUM PO Take 1 tablet by mouth daily.    Historical Provider, MD  Nyoka Cowden Coffee Bean-Yerba Mate (GREEN COFFEE BEAN EXTRACT PO) Take 1 tablet by mouth daily.  Historical Provider, MD  HYDROcodone-acetaminophen (NORCO/VICODIN) 5-325 MG per tablet Take 1 tablet by mouth every 6 (six) hours as needed for moderate pain or severe pain. 11/07/14   Kiandra Sanguinetti, PA-C  naproxen sodium (ALEVE) 220 MG tablet Take 220-440 mg by mouth 2 (two) times daily as needed.    Historical Provider, MD   BP 109/77 mmHg  Pulse 85  Temp(Src) 98.5 F (36.9 C) (Oral)  Resp 26  Wt 262 lb (118.842 kg)  SpO2 99%  LMP 10/31/2014 Physical Exam  Constitutional: She appears well-developed and well-nourished. No distress.  HENT:  Head: Normocephalic and atraumatic.  Eyes: Conjunctivae are normal.  Neck: Normal range of  motion.  Cardiovascular: Normal rate, regular rhythm and intact distal pulses.   Capillary refill < 3 sec  Pulmonary/Chest: Effort normal and breath sounds normal.  Musculoskeletal: She exhibits tenderness. She exhibits no edema.  ROM: Full extension and decreased flexion of the left knee; pt with large body habitus and difficult knee exam, no visible effusion or abnormal patellar movement, no erythema or increased warmth  Neurological: She is alert. Coordination normal.  Sensation intact to bilateral LE Strength 5/5 in bilateral LE with pain in the left knee during strength testing  Skin: Skin is warm and dry. She is not diaphoretic.  No tenting of the skin  Psychiatric: She has a normal mood and affect.  Nursing note and vitals reviewed.   ED Course  Procedures   DIAGNOSTIC STUDIES: Oxygen Saturation is 99% on RA, normal by my interpretation.    COORDINATION OF CARE: 5:17 PM Discussed treatment plan with pt which includes follow-up with orthopedist. Pt agreed to plan.   Labs Review Labs Reviewed - No data to display  Imaging Review No results found.   EKG Interpretation None      MDM   Final diagnoses:  Chronic knee pain, left  Fall from slip, trip, or stumble, initial encounter   Elizabeth Mcclure presents with increasing left knee pain that caused her to fall today, but she did not fall to the floor, hit her knee, head or twist her leg.  No indication for x-ray at this time. Pain managed in ED. Pt advised to follow up with orthopedics.  She is able to bear weight with pain. Patient given ACE bandage while in ED, conservative therapy recommended and discussed. Patient will be dc home & is agreeable with above plan.  BP 109/77 mmHg  Pulse 85  Temp(Src) 98.5 F (36.9 C) (Oral)  Resp 26  Wt 262 lb (118.842 kg)  SpO2 99%  LMP 10/31/2014  I personally performed the services described in this documentation, which was scribed in my presence. The recorded information has  been reviewed and is accurate.    Jarrett Soho Sharis Keeran, PA-C 11/07/14 1815  Quintella Reichert, MD 11/08/14 [phone]

## 2014-11-07 NOTE — ED Notes (Signed)
Pt st's she fell at work today landing on left knee, also st's she twisted right leg when she fell.  Pt st's unable to put any weight on left knee or straighten leg out.  Pt st's unable to stand to obtain wt.

## 2014-11-24 ENCOUNTER — Ambulatory Visit: Payer: Self-pay | Admitting: Orthopedic Surgery

## 2014-11-24 MED ORDER — DEXAMETHASONE SODIUM PHOSPHATE 4 MG/ML IJ SOLN: 4.0000 mg | INTRAMUSCULAR | Status: AC

## 2014-11-24 MED ORDER — ACETAMINOPHEN 10 MG/ML IV SOLN: 1000.0000 mg | INTRAVENOUS | Status: AC

## 2014-11-24 MED ORDER — SODIUM CHLORIDE 0.9 % IV SOLN: 4.0000 mg | INTRAVENOUS | Status: AC

## 2014-12-25 ENCOUNTER — Ambulatory Visit: Payer: Self-pay | Admitting: Orthopedic Surgery

## 2014-12-25 NOTE — H&P (Signed)
TOTAL KNEE ADMISSION H&P  Patient is being admitted for left total knee arthroplasty.  Subjective:  Chief Complaint:left knee pain.  HPI: Elizabeth Mcclure, 49 y.o. female, has a history of pain and functional disability in the left knee due to arthritis and has failed non-surgical conservative treatments for greater than 12 weeks to includeNSAID's and/or analgesics, corticosteriod injections, viscosupplementation injections, flexibility and strengthening excercises, use of assistive devices, weight reduction as appropriate and activity modification.  Onset of symptoms was gradual, starting >10 years ago with gradually worsening course since that time. The patient noted prior procedures on the knee to include  arthroscopy on the left knee(s).  Patient currently rates pain in the left knee(s) at 10 out of 10 with activity. Patient has night pain, worsening of pain with activity and weight bearing, pain that interferes with activities of daily living, pain with passive range of motion, crepitus, joint swelling and instability.  Patient has evidence of subchondral cysts, subchondral sclerosis, periarticular osteophytes, joint subluxation and joint space narrowing by imaging studies. There is no active infection.  Patient Active Problem List   Diagnosis Date Noted  . Cervical adenopathy 05/13/2012  . Encounter for screening colonoscopy 05/13/2012   Past Medical History  Diagnosis Date  . Joint pain   . Carpal tunnel syndrome   . Constipation     Past Surgical History  Procedure Laterality Date  . Vein closure      X2, needs vein stripping  . Left knee arthroscopy    . Colonoscopy  05/24/2012    Procedure: COLONOSCOPY;  Surgeon: Danie Binder, MD;  Location: [facility];  Service: Endoscopy;  Laterality: N/A;  9:30  . Cholecystectomy    . Dilation and curettage of uterus       (Not in a hospital admission) Allergies  Allergen Reactions  . Benadryl [Diphenhydramine] Hives  .  Penicillins     History  Substance Use Topics  . Smoking status: Current Every Day Smoker -- 0.25 packs/day for 10.5 years    Types: Cigarettes  . Smokeless tobacco: Not on file     Comment: Quit smoking x 13 years  . Alcohol Use: Yes     Comment: 2-3 drinks a year    Family History  Problem Relation Age of Onset  . Colon cancer Mother 12    passed away this year, 11-Oct-2011, day after Mother's day.      Review of Systems  Constitutional: Positive for fever, chills, malaise/fatigue and diaphoresis.  HENT: Negative.   Eyes: Negative.   Respiratory: Positive for wheezing.   Cardiovascular: Negative.   Gastrointestinal: Negative.   Genitourinary: Negative.   Musculoskeletal: Positive for myalgias and joint pain.  Neurological: Negative.   Endo/Heme/Allergies: Negative.   Psychiatric/Behavioral: Negative.     Objective:  Physical Exam  Constitutional: She is oriented to person, place, and time. She appears well-developed and well-nourished.  HENT:  Head: Normocephalic and atraumatic.  Eyes: Conjunctivae and EOM are normal. Pupils are equal, round, and reactive to light.  Neck: Normal range of motion. Neck supple.  Cardiovascular: Normal rate, regular rhythm, normal heart sounds and intact distal pulses.   Respiratory: Effort normal and breath sounds normal. No respiratory distress.  GI: Soft. Bowel sounds are normal. She exhibits no distension. There is no tenderness.  Genitourinary:  deferred  Musculoskeletal:       Left knee: She exhibits deformity. Tenderness found. Medial joint line tenderness noted.  ROM 10-95 degrees  Neurological: She is alert and  oriented to person, place, and time. She has normal reflexes.  Skin: Skin is warm and dry.  Psychiatric: She has a normal mood and affect. Her behavior is normal. Judgment and thought content normal.    Vital signs in last 24 hours: @VSRANGES @  Labs:   Estimated body mass index is 41.03 kg/(m^2) as calculated from the  following:   Height as of 05/24/12: 5\' 7"  (1.702 m).   Weight as of 11/07/14: 118.842 kg (262 lb).   Imaging Review Plain radiographs demonstrate severe degenerative joint disease of the left knee(s). The overall alignment issignificant varus. The bone quality appears to be adequate for age and reported activity level.  Assessment/Plan:  End stage arthritis, left knee   The patient history, physical examination, clinical judgment of the provider and imaging studies are consistent with end stage degenerative joint disease of the left knee(s) and total knee arthroplasty is deemed medically necessary. The treatment options including medical management, injection therapy arthroscopy and arthroplasty were discussed at length. The risks and benefits of total knee arthroplasty were presented and reviewed. The risks due to aseptic loosening, infection, stiffness, patella tracking problems, thromboembolic complications and other imponderables were discussed. The patient acknowledged the explanation, agreed to proceed with the plan and consent was signed. Patient is being admitted for inpatient treatment for surgery, pain control, PT, OT, prophylactic antibiotics, VTE prophylaxis, progressive ambulation and ADL's and discharge planning. The patient is planning to be discharged home vs rehab depending on social support

## 2015-01-12 ENCOUNTER — Encounter (HOSPITAL_COMMUNITY)
Admission: RE | Admit: 2015-01-12 | Discharge: 2015-01-12 | Disposition: A | Discharge status: Home / Self Care | Payer: BC Managed Care – PPO | Source: Ambulatory Visit | Attending: Orthopedic Surgery | Admitting: Orthopedic Surgery

## 2015-01-12 ENCOUNTER — Encounter (HOSPITAL_COMMUNITY): Payer: Self-pay

## 2015-01-12 DIAGNOSIS — M1712 Unilateral primary osteoarthritis, left knee: Secondary | ICD-10-CM | POA: Diagnosis not present

## 2015-01-12 DIAGNOSIS — Z01812 Encounter for preprocedural laboratory examination: Secondary | ICD-10-CM | POA: Insufficient documentation

## 2015-01-12 HISTORY — DX: Varicose veins of right lower extremity with other complications: I83.891

## 2015-01-12 HISTORY — DX: Unspecified osteoarthritis, unspecified site: M19.90

## 2015-01-12 HISTORY — DX: Gastro-esophageal reflux disease without esophagitis: K21.9

## 2015-01-12 HISTORY — DX: Chronic venous hypertension (idiopathic) without complications of right lower extremity: I87.301

## 2015-01-12 HISTORY — DX: Pneumonia, unspecified organism: J18.9

## 2015-01-12 LAB — CBC
HEMATOCRIT: 46.2 % — AB (ref 36.0–46.0)
Hemoglobin: 15.1 g/dL — ABNORMAL HIGH (ref 12.0–15.0)
MCH: 28 pg (ref 26.0–34.0)
MCHC: 32.7 g/dL (ref 30.0–36.0)
MCV: 85.7 fL (ref 78.0–100.0)
Platelets: 156 10*3/uL (ref 150–400)
RBC: 5.39 MIL/uL — ABNORMAL HIGH (ref 3.87–5.11)
RDW: 13.6 % (ref 11.5–15.5)
WBC: 8.5 10*3/uL (ref 4.0–10.5)

## 2015-01-12 LAB — URINALYSIS, ROUTINE W REFLEX MICROSCOPIC
BILIRUBIN URINE: NEGATIVE
Glucose, UA: NEGATIVE mg/dL
Hgb urine dipstick: NEGATIVE
Ketones, ur: NEGATIVE mg/dL
NITRITE: NEGATIVE
PH: 6.5 (ref 5.0–8.0)
Protein, ur: NEGATIVE mg/dL
SPECIFIC GRAVITY, URINE: 1.014 (ref 1.005–1.030)
UROBILINOGEN UA: 0.2 mg/dL (ref 0.0–1.0)

## 2015-01-12 LAB — COMPREHENSIVE METABOLIC PANEL
ALT: 14 U/L (ref 14–54)
AST: 17 U/L (ref 15–41)
Albumin: 4.1 g/dL (ref 3.5–5.0)
Alkaline Phosphatase: 71 U/L (ref 38–126)
Anion gap: 8 (ref 5–15)
BUN: 14 mg/dL (ref 6–20)
CHLORIDE: 106 mmol/L (ref 101–111)
CO2: 23 mmol/L (ref 22–32)
CREATININE: 0.8 mg/dL (ref 0.44–1.00)
Calcium: 9.4 mg/dL (ref 8.9–10.3)
GFR calc Af Amer: >60 mL/min (ref >60)
Glucose, Bld: 86 mg/dL (ref 65–99)
Potassium: 4.1 mmol/L (ref 3.5–5.1)
SODIUM: 137 mmol/L (ref 135–145)
Total Bilirubin: 0.5 mg/dL (ref 0.3–1.2)
Total Protein: 7.6 g/dL (ref 6.5–8.1)

## 2015-01-12 LAB — APTT: aPTT: 29 seconds (ref 24–37)

## 2015-01-12 LAB — SURGICAL PCR SCREEN
MRSA, PCR: NEGATIVE
Staphylococcus aureus: NEGATIVE

## 2015-01-12 LAB — HCG, SERUM, QUALITATIVE: PREG SERUM: NEGATIVE

## 2015-01-12 LAB — URINE MICROSCOPIC-ADD ON

## 2015-01-12 LAB — PROTIME-INR
INR: 0.96 (ref 0.00–1.49)
PROTHROMBIN TIME: 13 s (ref 11.6–15.2)

## 2015-01-12 NOTE — Progress Notes (Signed)
CBC results in epic per PAT visit 01/12/2015 sent to Dr Lyla Glassing

## 2015-01-12 NOTE — Progress Notes (Signed)
Clearance note per chart per Dr Manuella Ghazi

## 2015-01-12 NOTE — Patient Instructions (Signed)
Elizabeth Mcclure  01/12/2015   Your procedure is scheduled on: Thursday January 18, 2015   Report to [facility]  Entrance take [place]  elevators to 3rd floor to  [place] at 5:30 AM.  Call this number if you have problems the morning of surgery [phone]   Remember: ONLY 1 PERSON MAY GO WITH YOU TO SHORT STAY TO GET  READY MORNING OF [place].  Do not eat food or drink liquids :After Midnight.     Take these medicines the morning of surgery with A SIP OF WATER: NONE                               You may not have any metal on your body including hair pins and              piercings  Do not wear jewelry, make-up, lotions, powders or perfumes, deodorant             Do not wear nail polish.  Do not shave  48 hours prior to surgery.               Do not bring valuables to the hospital. [place].  Contacts, dentures or bridgework may not be worn into surgery.  Leave suitcase in the car. After surgery it may be brought to your room.     Patients discharged the day of surgery will not be allowed to drive home.  Name and phone number of your driver:  Special Instructions: DO NOT [place] TO SURGERY               Please read over the following fact sheets you were given:MRSA INFORMATION SHEET; INCENTIVE SPIROMETER; BLOOD TRANSFUSION INFORMATION SHEET  _____________________________________________________________________             [facility] - Preparing for Surgery Before surgery, you can play an important role.  Because skin is not sterile, your skin needs to be as free of germs as possible.  You can reduce the number of germs on your skin by washing with CHG (chlorahexidine gluconate) soap before surgery.  CHG is an antiseptic cleaner which kills germs and bonds with the skin to continue killing germs even after washing. Please DO NOT use if you have an allergy to CHG or  antibacterial soaps.  If your skin becomes reddened/irritated stop using the CHG and inform your nurse when you arrive at Short Stay. Do not shave (including legs and underarms) for at least 48 hours prior to the first CHG shower.  You may shave your face/neck. Please follow these instructions carefully:  1.  Shower with CHG Soap the night before surgery and the  morning of Surgery.  2.  If you choose to wash your hair, wash your hair first as usual with your  normal  shampoo.  3.  After you shampoo, rinse your hair and body thoroughly to remove the  shampoo.                           4.  Use CHG as you would any other liquid soap.  You can apply chg directly  to the skin and  wash                       Gently with a scrungie or clean washcloth.  5.  Apply the CHG Soap to your body ONLY FROM THE NECK DOWN.   Do not use on face/ open                           Wound or open sores. Avoid contact with eyes, ears mouth and genitals (private parts).                       Wash face,  Genitals (private parts) with your normal soap.             6.  Wash thoroughly, paying special attention to the area where your surgery  will be performed.  7.  Thoroughly rinse your body with warm water from the neck down.  8.  DO NOT shower/wash with your normal soap after using and rinsing off  the CHG Soap.                9.  Pat yourself dry with a clean towel.            10.  Wear clean pajamas.            11.  Place clean sheets on your bed the night of your first shower and do not  sleep with pets. Day of Surgery : Do not apply any lotions/deodorants the morning of surgery.  Please wear clean clothes to the hospital/surgery center.  FAILURE TO FOLLOW THESE INSTRUCTIONS MAY RESULT IN THE CANCELLATION OF YOUR SURGERY PATIENT SIGNATURE_________________________________  NURSE SIGNATURE__________________________________  ________________________________________________________________________   Adam Phenix  An incentive spirometer is a tool that can help keep your lungs clear and active. This tool measures how well you are filling your lungs with each breath. Taking long deep breaths may help reverse or decrease the chance of developing breathing (pulmonary) problems (especially infection) following:  A long period of time when you are unable to move or be active. BEFORE THE PROCEDURE   If the spirometer includes an indicator to show your best effort, your nurse or respiratory therapist will set it to a desired goal.  If possible, sit up straight or lean slightly forward. Try not to slouch.  Hold the incentive spirometer in an upright position. INSTRUCTIONS FOR USE   Sit on the edge of your bed if possible, or sit up as far as you can in bed or on a chair.  Hold the incentive spirometer in an upright position.  Breathe out normally.  Place the mouthpiece in your mouth and seal your lips tightly around it.  Breathe in slowly and as deeply as possible, raising the piston or the ball toward the top of the column.  Hold your breath for 3-5 seconds or for as long as possible. Allow the piston or ball to fall to the bottom of the column.  Remove the mouthpiece from your mouth and breathe out normally.  Rest for a few seconds and repeat Steps 1 through 7 at least 10 times every 1-2 hours when you are awake. Take your time and take a few normal breaths between deep breaths.  The spirometer may include an indicator to show your best effort. Use the indicator as a goal to work toward during each repetition.  After each set of 10 deep  breaths, practice coughing to be sure your lungs are clear. If you have an incision (the cut made at the time of surgery), support your incision when coughing by placing a pillow or rolled up towels firmly against it. Once you are able to get out of bed, walk around indoors and cough well. You may stop using the incentive spirometer when instructed by  your caregiver.  RISKS AND COMPLICATIONS  Take your time so you do not get dizzy or light-headed.  If you are in pain, you may need to take or ask for pain medication before doing incentive spirometry. It is harder to take a deep breath if you are having pain. AFTER USE  Rest and breathe slowly and easily.  It can be helpful to keep track of a log of your progress. Your caregiver can provide you with a simple table to help with this. If you are using the spirometer at home, follow these instructions: [place] IF:   You are having difficultly using the spirometer.  You have trouble using the spirometer as often as instructed.  Your pain medication is not giving enough relief while using the spirometer.  You develop fever of 100.5 F (38.1 C) or higher. SEEK IMMEDIATE MEDICAL CARE IF:   You cough up bloody sputum that had not been present before.  You develop fever of 102 F (38.9 C) or greater.  You develop worsening pain at or near the incision site. MAKE SURE YOU:   Understand these instructions.  Will watch your condition.  Will get help right away if you are not doing well or get worse. Document Released: 11/03/2006 Document Revised: 09/15/2011 Document Reviewed: 01/04/2007 ExitCare Patient Information 2014 ExitCare, [place].   ________________________________________________________________________  WHAT IS A BLOOD TRANSFUSION? Blood Transfusion Information  A transfusion is the replacement of blood or some of its parts. Blood is made up of multiple cells which provide different functions.  Red blood cells carry oxygen and are used for blood loss replacement.  White blood cells fight against infection.  Platelets control bleeding.  Plasma helps clot blood.  Other blood products are available for specialized needs, such as hemophilia or other clotting disorders. BEFORE THE TRANSFUSION  Who gives blood for transfusions?   Healthy volunteers who are  fully evaluated to make sure their blood is safe. This is blood bank blood. Transfusion therapy is the safest it has ever been in the practice of medicine. Before blood is taken from a donor, a complete history is taken to make sure that person has no history of diseases nor engages in risky social behavior (examples are intravenous drug use or sexual activity with multiple partners). The donor's travel history is screened to minimize risk of transmitting infections, such as malaria. The donated blood is tested for signs of infectious diseases, such as HIV and hepatitis. The blood is then tested to be sure it is compatible with you in order to minimize the chance of a transfusion reaction. If you or a relative donates blood, this is often done in anticipation of surgery and is not appropriate for emergency situations. It takes many days to process the donated blood. RISKS AND COMPLICATIONS Although transfusion therapy is very safe and saves many lives, the main dangers of transfusion include:   Getting an infectious disease.  Developing a transfusion reaction. This is an allergic reaction to something in the blood you were given. Every precaution is taken to prevent this. The decision to have a blood transfusion has  been considered carefully by your caregiver before blood is given. Blood is not given unless the benefits outweigh the risks. AFTER THE TRANSFUSION  Right after receiving a blood transfusion, you will usually feel much better and more energetic. This is especially true if your red blood cells have gotten low (anemic). The transfusion raises the level of the red blood cells which carry oxygen, and this usually causes an energy increase.  The nurse administering the transfusion will monitor you carefully for complications. HOME CARE INSTRUCTIONS  No special instructions are needed after a transfusion. You may find your energy is better. Speak with your caregiver about any limitations on  activity for underlying diseases you may have. SEEK MEDICAL CARE IF:   Your condition is not improving after your transfusion.  You develop redness or irritation at the intravenous (IV) site. SEEK IMMEDIATE MEDICAL CARE IF:  Any of the following symptoms occur over the next 12 hours:  Shaking chills.  You have a temperature by mouth above 102 F (38.9 C), not controlled by medicine.  Chest, back, or muscle pain.  People around you feel you are not acting correctly or are confused.  Shortness of breath or difficulty breathing.  Dizziness and fainting.  You get a rash or develop hives.  You have a decrease in urine output.  Your urine turns a dark color or changes to pink, red, or brown. Any of the following symptoms occur over the next 10 days:  You have a temperature by mouth above 102 F (38.9 C), not controlled by medicine.  Shortness of breath.  Weakness after normal activity.  The white part of the eye turns yellow (jaundice).  You have a decrease in the amount of urine or are urinating less often.  Your urine turns a dark color or changes to pink, red, or brown. Document Released: 06/20/2000 Document Revised: 09/15/2011 Document Reviewed: 02/07/2008 [facility] Patient Information 2014 [place], [place].  _______________________________________________________________________

## 2015-01-17 MED ORDER — DEXTROSE 5 % IV SOLN
3.0000 g | INTRAVENOUS | Status: AC
  Administered 2015-01-18 (×2): 3 g via INTRAVENOUS
  Filled 2015-01-17: qty 3000

## 2015-01-18 ENCOUNTER — Inpatient Hospital Stay (HOSPITAL_COMMUNITY): Payer: BC Managed Care – PPO | Admitting: Registered Nurse

## 2015-01-18 ENCOUNTER — Encounter (HOSPITAL_COMMUNITY): Payer: Self-pay | Admitting: *Deleted

## 2015-01-18 ENCOUNTER — Encounter (HOSPITAL_COMMUNITY)
Admission: RE | Disposition: A | Discharge status: Home Health | Payer: Self-pay | Source: Ambulatory Visit | Attending: Orthopedic Surgery

## 2015-01-18 ENCOUNTER — Inpatient Hospital Stay (HOSPITAL_COMMUNITY)
Admission: RE | Admit: 2015-01-18 | Discharge: 2015-01-21 | DRG: 470 | Disposition: A | Discharge status: Home Health | Payer: BC Managed Care – PPO | Source: Ambulatory Visit | Attending: Orthopedic Surgery | Admitting: Orthopedic Surgery

## 2015-01-18 ENCOUNTER — Inpatient Hospital Stay (HOSPITAL_COMMUNITY): Payer: BC Managed Care – PPO

## 2015-01-18 DIAGNOSIS — G5600 Carpal tunnel syndrome, unspecified upper limb: Secondary | ICD-10-CM | POA: Diagnosis present

## 2015-01-18 DIAGNOSIS — F17210 Nicotine dependence, cigarettes, uncomplicated: Secondary | ICD-10-CM | POA: Diagnosis present

## 2015-01-18 DIAGNOSIS — Z01812 Encounter for preprocedural laboratory examination: Secondary | ICD-10-CM

## 2015-01-18 DIAGNOSIS — I87301 Chronic venous hypertension (idiopathic) without complications of right lower extremity: Secondary | ICD-10-CM | POA: Diagnosis present

## 2015-01-18 DIAGNOSIS — Z800 Family history of malignant neoplasm of digestive organs: Secondary | ICD-10-CM | POA: Diagnosis not present

## 2015-01-18 DIAGNOSIS — Z888 Allergy status to other drugs, medicaments and biological substances status: Secondary | ICD-10-CM

## 2015-01-18 DIAGNOSIS — M1712 Unilateral primary osteoarthritis, left knee: Secondary | ICD-10-CM | POA: Diagnosis present

## 2015-01-18 DIAGNOSIS — K219 Gastro-esophageal reflux disease without esophagitis: Secondary | ICD-10-CM | POA: Diagnosis present

## 2015-01-18 DIAGNOSIS — Z880 Allergy status to penicillin: Secondary | ICD-10-CM

## 2015-01-18 DIAGNOSIS — Z6841 Body Mass Index (BMI) 40.0 and over, adult: Secondary | ICD-10-CM | POA: Diagnosis not present

## 2015-01-18 DIAGNOSIS — Z09 Encounter for follow-up examination after completed treatment for conditions other than malignant neoplasm: Secondary | ICD-10-CM

## 2015-01-18 DIAGNOSIS — M25562 Pain in left knee: Secondary | ICD-10-CM | POA: Diagnosis present

## 2015-01-18 HISTORY — PX: TOTAL KNEE ARTHROPLASTY: SHX125

## 2015-01-18 LAB — ABO/RH: ABO/RH(D): O POS

## 2015-01-18 LAB — TYPE AND SCREEN
ABO/RH(D): O POS
Antibody Screen: NEGATIVE

## 2015-01-18 SURGERY — ARTHROPLASTY, KNEE, TOTAL
Anesthesia: Spinal | Site: Knee | Laterality: Left

## 2015-01-18 MED ORDER — SODIUM CHLORIDE 0.9 % IV SOLN
INTRAVENOUS | Status: DC
  Administered 2015-01-18: 23:00:00 via INTRAVENOUS
  Administered 2015-01-18: 150 mL/h via INTRAVENOUS
  Administered 2015-01-19: 05:00:00 via INTRAVENOUS

## 2015-01-18 MED ORDER — SODIUM CHLORIDE 0.9 % IV SOLN: INTRAVENOUS | Status: DC

## 2015-01-18 MED ORDER — SENNA 8.6 MG PO TABS
2.0000 | ORAL_TABLET | Freq: Every day | ORAL | Status: DC
  Administered 2015-01-18 – 2015-01-20 (×3): 17.2 mg via ORAL

## 2015-01-18 MED ORDER — LACTATED RINGERS IV SOLN: INTRAVENOUS | Status: DC

## 2015-01-18 MED ORDER — FENTANYL CITRATE (PF) 100 MCG/2ML IJ SOLN
25.0000 ug | INTRAMUSCULAR | Status: DC | PRN
  Administered 2015-01-18 (×2): 50 ug via INTRAVENOUS

## 2015-01-18 MED ORDER — DEXAMETHASONE SODIUM PHOSPHATE 4 MG/ML IJ SOLN
4.0000 mg | INTRAMUSCULAR | Status: AC
  Administered 2015-01-18: 10 mg via INTRAVENOUS
  Filled 2015-01-18: qty 1

## 2015-01-18 MED ORDER — KETOROLAC TROMETHAMINE 30 MG/ML IJ SOLN
INTRAMUSCULAR | Status: DC | PRN
  Administered 2015-01-18: 30 mg via INTRAVENOUS

## 2015-01-18 MED ORDER — SODIUM CHLORIDE 0.9 % IJ SOLN
INTRAMUSCULAR | Status: AC
  Filled 2015-01-18: qty 50

## 2015-01-18 MED ORDER — ONDANSETRON HCL 4 MG/2ML IJ SOLN
INTRAMUSCULAR | Status: AC
  Filled 2015-01-18: qty 2

## 2015-01-18 MED ORDER — PROPOFOL 10 MG/ML IV BOLUS
INTRAVENOUS | Status: AC
  Filled 2015-01-18: qty 20

## 2015-01-18 MED ORDER — BUPIVACAINE HCL (PF) 0.5 % IJ SOLN
INTRAMUSCULAR | Status: AC
  Filled 2015-01-18: qty 30

## 2015-01-18 MED ORDER — SENNA 8.6 MG PO TABS
2.0000 | ORAL_TABLET | Freq: Every day | ORAL | Status: DC
Start: 2015-01-18 — End: 2016-06-27

## 2015-01-18 MED ORDER — HYDROGEN PEROXIDE 3 % EX SOLN
CUTANEOUS | Status: AC
Start: 2015-01-18 — End: 2015-01-18
  Filled 2015-01-18: qty 473

## 2015-01-18 MED ORDER — SODIUM CHLORIDE 0.9 % IR SOLN
Status: DC | PRN
  Administered 2015-01-18: 3000 mL

## 2015-01-18 MED ORDER — METOCLOPRAMIDE HCL 10 MG PO TABS: 5.0000 mg | ORAL_TABLET | Freq: Three times a day (TID) | ORAL | Status: DC | PRN

## 2015-01-18 MED ORDER — LIDOCAINE HCL (CARDIAC) 20 MG/ML IV SOLN
INTRAVENOUS | Status: DC | PRN
  Administered 2015-01-18: 100 mg via INTRAVENOUS

## 2015-01-18 MED ORDER — CHLORHEXIDINE GLUCONATE 4 % EX LIQD: 60.0000 mL | Freq: Once | CUTANEOUS | Status: DC

## 2015-01-18 MED ORDER — ACETAMINOPHEN 325 MG PO TABS
650.0000 mg | ORAL_TABLET | Freq: Four times a day (QID) | ORAL | Status: DC | PRN
  Administered 2015-01-19: 650 mg via ORAL
  Filled 2015-01-18 (×2): qty 2

## 2015-01-18 MED ORDER — LACTATED RINGERS IV SOLN
INTRAVENOUS | Status: DC | PRN
Start: 2015-01-18 — End: 2015-01-18
  Administered 2015-01-18: 07:00:00 via INTRAVENOUS

## 2015-01-18 MED ORDER — SODIUM CHLORIDE 0.9 % IJ SOLN
INTRAMUSCULAR | Status: AC
  Filled 2015-01-18: qty 10

## 2015-01-18 MED ORDER — HYDROCODONE-ACETAMINOPHEN 5-325 MG PO TABS
1.0000 | ORAL_TABLET | ORAL | Status: DC | PRN
  Administered 2015-01-18: 1 via ORAL
  Administered 2015-01-18 – 2015-01-19 (×5): 2 via ORAL
  Administered 2015-01-19: 1 via ORAL
  Administered 2015-01-20 – 2015-01-21 (×8): 2 via ORAL
  Filled 2015-01-18 (×16): qty 2

## 2015-01-18 MED ORDER — HYDROGEN PEROXIDE 3 % EX SOLN
CUTANEOUS | Status: DC | PRN
  Administered 2015-01-18: 1

## 2015-01-18 MED ORDER — ACETAMINOPHEN 650 MG RE SUPP: 650.0000 mg | Freq: Four times a day (QID) | RECTAL | Status: DC | PRN

## 2015-01-18 MED ORDER — SODIUM CHLORIDE 0.9 % IR SOLN
Status: DC | PRN
  Administered 2015-01-18: 1000 mL

## 2015-01-18 MED ORDER — ONDANSETRON HCL 4 MG/2ML IJ SOLN
4.0000 mg | Freq: Four times a day (QID) | INTRAMUSCULAR | Status: DC | PRN
  Administered 2015-01-18 – 2015-01-20 (×5): 4 mg via INTRAVENOUS
  Filled 2015-01-18 (×5): qty 2

## 2015-01-18 MED ORDER — POVIDONE-IODINE 10 % EX SOLN
CUTANEOUS | Status: DC | PRN
  Administered 2015-01-18: 1 via TOPICAL

## 2015-01-18 MED ORDER — FENTANYL CITRATE (PF) 100 MCG/2ML IJ SOLN
INTRAMUSCULAR | Status: DC | PRN
  Administered 2015-01-18 (×2): 50 ug via INTRAVENOUS

## 2015-01-18 MED ORDER — ISOPROPYL ALCOHOL 70 % SOLN
Status: AC
  Filled 2015-01-18: qty 480

## 2015-01-18 MED ORDER — DEXAMETHASONE SODIUM PHOSPHATE 10 MG/ML IJ SOLN
INTRAMUSCULAR | Status: AC
  Filled 2015-01-18: qty 1

## 2015-01-18 MED ORDER — PREGABALIN 75 MG PO CAPS
75.0000 mg | ORAL_CAPSULE | Freq: Two times a day (BID) | ORAL | Status: DC
  Administered 2015-01-18 – 2015-01-21 (×6): 75 mg via ORAL
  Filled 2015-01-18 (×6): qty 1

## 2015-01-18 MED ORDER — PHENOL 1.4 % MT LIQD: 1.0000 | OROMUCOSAL | Status: DC | PRN

## 2015-01-18 MED ORDER — APIXABAN 2.5 MG PO TABS
2.5000 mg | ORAL_TABLET | Freq: Two times a day (BID) | ORAL | Status: DC
  Administered 2015-01-19 – 2015-01-21 (×5): 2.5 mg via ORAL
  Filled 2015-01-18 (×7): qty 1

## 2015-01-18 MED ORDER — HYDROMORPHONE HCL 1 MG/ML IJ SOLN
0.5000 mg | INTRAMUSCULAR | Status: DC | PRN
  Administered 2015-01-18 – 2015-01-20 (×6): 0.5 mg via INTRAVENOUS
  Filled 2015-01-18 (×6): qty 1

## 2015-01-18 MED ORDER — PROPOFOL 10 MG/ML IV BOLUS
INTRAVENOUS | Status: AC
Start: 2015-01-18 — End: 2015-01-18
  Filled 2015-01-18: qty 20

## 2015-01-18 MED ORDER — KETOROLAC TROMETHAMINE 15 MG/ML IJ SOLN
15.0000 mg | Freq: Four times a day (QID) | INTRAMUSCULAR | Status: AC
  Administered 2015-01-18 – 2015-01-19 (×4): 15 mg via INTRAVENOUS
  Filled 2015-01-18 (×4): qty 1

## 2015-01-18 MED ORDER — PROMETHAZINE HCL 25 MG/ML IJ SOLN: 6.2500 mg | INTRAMUSCULAR | Status: DC | PRN

## 2015-01-18 MED ORDER — PROPOFOL INFUSION 10 MG/ML OPTIME
INTRAVENOUS | Status: DC | PRN
  Administered 2015-01-18: 20 ug/kg/min via INTRAVENOUS

## 2015-01-18 MED ORDER — EPHEDRINE SULFATE 50 MG/ML IJ SOLN
INTRAMUSCULAR | Status: DC | PRN
  Administered 2015-01-18 (×2): 5 mg via INTRAVENOUS

## 2015-01-18 MED ORDER — ACETAMINOPHEN 10 MG/ML IV SOLN
INTRAVENOUS | Status: AC
Start: 2015-01-18 — End: 2015-01-18
  Filled 2015-01-18: qty 100

## 2015-01-18 MED ORDER — BUPIVACAINE-EPINEPHRINE (PF) 0.25% -1:200000 IJ SOLN
INTRAMUSCULAR | Status: AC
  Filled 2015-01-18: qty 30

## 2015-01-18 MED ORDER — DOCUSATE SODIUM 100 MG PO CAPS
100.0000 mg | ORAL_CAPSULE | Freq: Two times a day (BID) | ORAL | Status: DC
  Administered 2015-01-18 – 2015-01-21 (×6): 100 mg via ORAL

## 2015-01-18 MED ORDER — BUPIVACAINE HCL (PF) 0.5 % IJ SOLN
INTRAMUSCULAR | Status: DC | PRN
  Administered 2015-01-18: 3 mL via INTRATHECAL

## 2015-01-18 MED ORDER — OXYCODONE HCL ER 10 MG PO T12A: 10.0000 mg | EXTENDED_RELEASE_TABLET | ORAL | Status: DC

## 2015-01-18 MED ORDER — KETAMINE HCL 10 MG/ML IJ SOLN
INTRAMUSCULAR | Status: DC | PRN
  Administered 2015-01-18: 25 mg via INTRAVENOUS
  Administered 2015-01-18 (×2): 50 mg via INTRAVENOUS
  Administered 2015-01-18: 25 mg via INTRAVENOUS
  Administered 2015-01-18: 50 mg via INTRAVENOUS

## 2015-01-18 MED ORDER — LIDOCAINE HCL (CARDIAC) 20 MG/ML IV SOLN
INTRAVENOUS | Status: AC
  Filled 2015-01-18: qty 5

## 2015-01-18 MED ORDER — ONDANSETRON HCL 4 MG/2ML IJ SOLN
INTRAMUSCULAR | Status: DC | PRN
  Administered 2015-01-18: 4 mg via INTRAVENOUS

## 2015-01-18 MED ORDER — FENTANYL CITRATE (PF) 100 MCG/2ML IJ SOLN
INTRAMUSCULAR | Status: AC
  Filled 2015-01-18: qty 2

## 2015-01-18 MED ORDER — MIDAZOLAM HCL 5 MG/5ML IJ SOLN
INTRAMUSCULAR | Status: DC | PRN
  Administered 2015-01-18: 2 mg via INTRAVENOUS

## 2015-01-18 MED ORDER — APIXABAN 2.5 MG PO TABS: 2.5000 mg | ORAL_TABLET | Freq: Two times a day (BID) | ORAL | Status: DC

## 2015-01-18 MED ORDER — KETOROLAC TROMETHAMINE 30 MG/ML IJ SOLN
INTRAMUSCULAR | Status: AC
  Filled 2015-01-18: qty 1

## 2015-01-18 MED ORDER — ONDANSETRON HCL 4 MG PO TABS
4.0000 mg | ORAL_TABLET | Freq: Four times a day (QID) | ORAL | Status: DC | PRN
  Administered 2015-01-21: 4 mg via ORAL
  Filled 2015-01-18: qty 1

## 2015-01-18 MED ORDER — CEFAZOLIN SODIUM-DEXTROSE 2-3 GM-% IV SOLR
2.0000 g | Freq: Four times a day (QID) | INTRAVENOUS | Status: AC
  Administered 2015-01-18 – 2015-01-19 (×2): 2 g via INTRAVENOUS
  Filled 2015-01-18 (×2): qty 50

## 2015-01-18 MED ORDER — ONDANSETRON HCL 4 MG/2ML IJ SOLN: 4.0000 mg | INTRAMUSCULAR | Status: DC

## 2015-01-18 MED ORDER — BUPIVACAINE-EPINEPHRINE (PF) 0.25% -1:200000 IJ SOLN
INTRAMUSCULAR | Status: DC | PRN
  Administered 2015-01-18: 30 mL

## 2015-01-18 MED ORDER — KETAMINE HCL 10 MG/ML IJ SOLN
INTRAMUSCULAR | Status: AC
  Filled 2015-01-18: qty 1

## 2015-01-18 MED ORDER — HYDROCODONE-ACETAMINOPHEN 5-325 MG PO TABS: 1.0000 | ORAL_TABLET | ORAL | Status: DC | PRN

## 2015-01-18 MED ORDER — MEPERIDINE HCL 50 MG/ML IJ SOLN: 6.2500 mg | INTRAMUSCULAR | Status: DC | PRN

## 2015-01-18 MED ORDER — TRANEXAMIC ACID 1000 MG/10ML IV SOLN
1000.0000 mg | INTRAVENOUS | Status: AC
  Administered 2015-01-18: 1000 mg via INTRAVENOUS
  Filled 2015-01-18: qty 10

## 2015-01-18 MED ORDER — MENTHOL 3 MG MT LOZG: 1.0000 | LOZENGE | OROMUCOSAL | Status: DC | PRN

## 2015-01-18 MED ORDER — EPHEDRINE SULFATE 50 MG/ML IJ SOLN
INTRAMUSCULAR | Status: AC
  Filled 2015-01-18: qty 1

## 2015-01-18 MED ORDER — DEXTROSE 5 % IV SOLN
3.0000 g | Freq: Once | INTRAVENOUS | Status: DC
  Filled 2015-01-18: qty 3000

## 2015-01-18 MED ORDER — ALUM & MAG HYDROXIDE-SIMETH 200-200-20 MG/5ML PO SUSP: 30.0000 mL | ORAL | Status: DC | PRN

## 2015-01-18 MED ORDER — OXYCODONE HCL ER 10 MG PO T12A
10.0000 mg | EXTENDED_RELEASE_TABLET | Freq: Two times a day (BID) | ORAL | Status: DC
  Administered 2015-01-18 – 2015-01-21 (×6): 10 mg via ORAL
  Filled 2015-01-18 (×6): qty 1

## 2015-01-18 MED ORDER — METOCLOPRAMIDE HCL 5 MG/ML IJ SOLN
5.0000 mg | Freq: Three times a day (TID) | INTRAMUSCULAR | Status: DC | PRN
  Administered 2015-01-18: 10 mg via INTRAVENOUS
  Filled 2015-01-18: qty 2

## 2015-01-18 MED ORDER — DOCUSATE SODIUM 100 MG PO CAPS: 100.0000 mg | ORAL_CAPSULE | Freq: Two times a day (BID) | ORAL | Status: DC

## 2015-01-18 MED ORDER — MIDAZOLAM HCL 2 MG/2ML IJ SOLN
INTRAMUSCULAR | Status: AC
  Filled 2015-01-18: qty 2

## 2015-01-18 MED ORDER — DEXTROSE 5 % IV SOLN
500.0000 mg | Freq: Four times a day (QID) | INTRAVENOUS | Status: DC | PRN
Start: 2015-01-18 — End: 2015-01-21
  Administered 2015-01-18 (×2): 500 mg via INTRAVENOUS
  Filled 2015-01-18 (×4): qty 5

## 2015-01-18 MED ORDER — ONDANSETRON 8 MG PO TBDP: 8.0000 mg | ORAL_TABLET | Freq: Three times a day (TID) | ORAL | Status: DC | PRN

## 2015-01-18 MED ORDER — METHOCARBAMOL 500 MG PO TABS
500.0000 mg | ORAL_TABLET | Freq: Four times a day (QID) | ORAL | Status: DC | PRN
  Administered 2015-01-19 – 2015-01-21 (×8): 500 mg via ORAL
  Filled 2015-01-18 (×8): qty 1

## 2015-01-18 MED ORDER — SODIUM CHLORIDE 0.9 % IR SOLN
Status: DC | PRN
  Administered 2015-01-18: 1000 mL
  Administered 2015-01-18: 1

## 2015-01-18 MED ORDER — ACETAMINOPHEN 10 MG/ML IV SOLN
1000.0000 mg | INTRAVENOUS | Status: AC
  Administered 2015-01-18: 1000 mg via INTRAVENOUS

## 2015-01-18 MED ORDER — SODIUM CHLORIDE 0.9 % IJ SOLN
INTRAMUSCULAR | Status: DC | PRN
  Administered 2015-01-18: 50 mL

## 2015-01-18 SURGICAL SUPPLY — 70 items
BAG SPEC THK2 15X12 ZIP CLS (MISCELLANEOUS)
BAG ZIPLOCK 12X15 (MISCELLANEOUS) IMPLANT
BANDAGE ELASTIC 4 VELCRO ST LF (GAUZE/BANDAGES/DRESSINGS) ×3 IMPLANT
BANDAGE ELASTIC 6 VELCRO ST LF (GAUZE/BANDAGES/DRESSINGS) ×3 IMPLANT
BANDAGE ESMARK 6X9 LF (GAUZE/BANDAGES/DRESSINGS) ×1 IMPLANT
BIT DRILL 2.5X110 QC LCP DISP (BIT) ×2 IMPLANT
BLADE SAW RECIPROCATING 77.5 (BLADE) ×3 IMPLANT
BNDG CMPR 9X6 STRL LF SNTH (GAUZE/BANDAGES/DRESSINGS) ×1
BNDG ESMARK 6X9 LF (GAUZE/BANDAGES/DRESSINGS) ×3
BONE CEMENT SIMPLEX TOBRAMYCIN (Cement) ×9 IMPLANT
CAPT KNEE TOTAL 3 ×2 IMPLANT
CEMENT BONE SIMPLEX TOBRAMYCIN (Cement) IMPLANT
CHLORAPREP W/TINT 26ML (MISCELLANEOUS) ×8 IMPLANT
CUFF TOURN SGL QUICK 34 (TOURNIQUET CUFF) ×3
CUFF TRNQT CYL 34X4X40X1 (TOURNIQUET CUFF) ×1 IMPLANT
DECANTER SPIKE VIAL GLASS SM (MISCELLANEOUS) ×3 IMPLANT
DRAPE EXTREMITY T 121X128X90 (DRAPE) ×3 IMPLANT
DRAPE LG THREE QUARTER DISP (DRAPES) ×7 IMPLANT
DRAPE POUCH INSTRU U-SHP 10X18 (DRAPES) ×3 IMPLANT
DRAPE U-SHAPE 47X51 STRL (DRAPES) ×3 IMPLANT
DRSG AQUACEL AG ADV 3.5X10 (GAUZE/BANDAGES/DRESSINGS) ×3 IMPLANT
DRSG AQUACEL AG ADV 3.5X14 (GAUZE/BANDAGES/DRESSINGS) ×2 IMPLANT
DRSG TEGADERM 4X4.75 (GAUZE/BANDAGES/DRESSINGS) IMPLANT
ELECT PENCIL ROCKER SW 15FT (MISCELLANEOUS) ×3 IMPLANT
ELECT REM PT RETURN 15FT ADLT (MISCELLANEOUS) ×3 IMPLANT
EVACUATOR 1/8 PVC DRAIN (DRAIN) ×2 IMPLANT
FACESHIELD WRAPAROUND (MASK) ×9 IMPLANT
FACESHIELD WRAPAROUND OR TEAM (MASK) ×3 IMPLANT
GAUZE SPONGE 4X4 12PLY STRL (GAUZE/BANDAGES/DRESSINGS) ×3 IMPLANT
GLOVE BIO SURGEON STRL SZ8.5 (GLOVE) ×6 IMPLANT
GLOVE BIOGEL PI IND STRL 8.5 (GLOVE) ×1 IMPLANT
GLOVE BIOGEL PI INDICATOR 8.5 (GLOVE) ×2
GOWN SPEC L3 XXLG W/TWL (GOWN DISPOSABLE) ×3 IMPLANT
HANDPIECE INTERPULSE COAX TIP (DISPOSABLE) ×3
HOOD PEEL AWAY FACE SHEILD DIS (HOOD) ×6 IMPLANT
KIT BASIN OR (CUSTOM PROCEDURE TRAY) ×3 IMPLANT
LIQUID BAND (GAUZE/BANDAGES/DRESSINGS) ×4 IMPLANT
MANIFOLD NEPTUNE II (INSTRUMENTS) ×3 IMPLANT
NDL SPNL 18GX3.5 QUINCKE PK (NEEDLE) ×1 IMPLANT
NEEDLE SPNL 18GX3.5 QUINCKE PK (NEEDLE) ×3 IMPLANT
PACK TOTAL JOINT (CUSTOM PROCEDURE TRAY) ×3 IMPLANT
PADDING CAST COTTON 6X4 STRL (CAST SUPPLIES) ×3 IMPLANT
PEN SKIN MARKING BROAD (MISCELLANEOUS) ×3 IMPLANT
POSITIONER SURGICAL ARM (MISCELLANEOUS) ×3 IMPLANT
RESTRICTOR CEMENT SZ 5 C-STEM (Cement) ×2 IMPLANT
SAW OSC TIP CART 19.5X105X1.3 (SAW) ×3 IMPLANT
SCREW CORTEX 3.5 16MM (Screw) ×4 IMPLANT
SCREW LOCK CORT ST 3.5X16 (Screw) IMPLANT
SEALER BIPOLAR AQUA 6.0 (INSTRUMENTS) ×3 IMPLANT
SET HNDPC FAN SPRY TIP SCT (DISPOSABLE) ×1 IMPLANT
SET PAD KNEE POSITIONER (MISCELLANEOUS) ×3 IMPLANT
SOL PREP POV-IOD 4OZ 10% (MISCELLANEOUS) ×3 IMPLANT
SPONGE DRAIN TRACH 4X4 STRL 2S (GAUZE/BANDAGES/DRESSINGS) ×2 IMPLANT
STEM CEMENTED TRIATHLON (Stem) ×2 IMPLANT
SUCTION FRAZIER 12FR DISP (SUCTIONS) IMPLANT
SUT MNCRL AB 3-0 PS2 18 (SUTURE) ×3 IMPLANT
SUT MON AB 2-0 CT1 36 (SUTURE) ×8 IMPLANT
SUT VIC AB 1 CT1 36 (SUTURE) ×3 IMPLANT
SUT VIC AB 2-0 CT1 27 (SUTURE) ×3
SUT VIC AB 2-0 CT1 TAPERPNT 27 (SUTURE) ×1 IMPLANT
SUT VLOC 180 0 24IN GS25 (SUTURE) ×3 IMPLANT
SYR 50ML LL SCALE MARK (SYRINGE) ×3 IMPLANT
TOWEL OR 17X26 10 PK STRL BLUE (TOWEL DISPOSABLE) ×6 IMPLANT
TOWEL OR NON WOVEN STRL DISP B (DISPOSABLE) ×3 IMPLANT
TOWER CARTRIDGE SMART MIX (DISPOSABLE) ×2 IMPLANT
TRAY FOLEY W/METER SILVER 14FR (SET/KITS/TRAYS/PACK) ×3 IMPLANT
WASHER 7MM DIA (Washer) ×4 IMPLANT
WATER STERILE IRR 1500ML POUR (IV SOLUTION) ×3 IMPLANT
WRAP KNEE MAXI GEL POST OP (GAUZE/BANDAGES/DRESSINGS) ×3 IMPLANT
YANKAUER SUCT BULB TIP 10FT TU (MISCELLANEOUS) ×3 IMPLANT

## 2015-01-18 NOTE — Discharge Summary (Signed)
Physician Discharge Summary  Patient ID: [name] MRN: [id number] DOB/AGE: 49-Nov-1967 49 y.o.  Admit date: 01/18/2015 Discharge date: 01/21/2015  Admission Diagnoses:  Primary osteoarthritis of left knee  Discharge Diagnoses:  Principal Problem:   Primary osteoarthritis of left knee   Past Medical History  Diagnosis Date  . Joint pain   . Carpal tunnel syndrome   . Constipation   . Varicose veins of right leg with edema     pt states has had vein closure twice in this leg saw Dr Early 5 years ago pt states has not seen since edema decreases with elevation   . Pneumonia     hx of twice  . GERD (gastroesophageal reflux disease)   . Arthritis   . 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 female who was admitted 01/18/2015 with a diagnosis of Primary osteoarthritis of left knee and went to the operating room on 01/18/2015 and underwent the above named procedures.    She was given perioperative antibiotics:      Anti-infectives    Start     Dose/Rate Route Frequency Ordered Stop   01/18/15 1800  ceFAZolin (ANCEF) IVPB 2 g/50 mL premix     2 g 100 mL/hr over 30 Minutes Intravenous Every 6 hours 01/18/15 1417 01/19/15 0141   01/18/15 1130  ceFAZolin (ANCEF) 3 g in dextrose 5 % 50 mL IVPB  Status:  Discontinued     3 g 160 mL/hr over 30 Minutes Intravenous  Once 01/18/15 1129 01/18/15 1413   01/18/15 0600  ceFAZolin (ANCEF) 3 g in dextrose 5 % 50 mL IVPB     3 g 160 mL/hr over 30 Minutes Intravenous On call to O.R. 01/17/15 1337 01/18/15 1135    .  She was given sequential compression devices, early ambulation, and apixiban for DVT prophylaxis.  She benefited maximally from the hospital stay and there were no complications.    Recent vital signs:   Filed Vitals:   01/21/15 0425  BP: 109/74  Pulse: 77  Temp: 98.7 F (37.1 C)  Resp: 20    Recent laboratory studies:  Lab Results  Component Value Date   HGB 9.8* 01/21/2015   HGB 10.5* 01/20/2015   HGB 11.4* 01/19/2015   Lab Results  Component Value Date   WBC 7.1 01/21/2015   PLT 130* 01/21/2015   Lab Results  Component Value Date   INR 0.96 01/12/2015   Lab Results  Component Value Date   NA 138 01/19/2015   K 4.3 01/19/2015   CL 110 01/19/2015   CO2 24 01/19/2015   BUN 15 01/19/2015   CREATININE 0.83 01/19/2015   GLUCOSE 123* 01/19/2015    Discharge Medications:     Medication List    TAKE these medications        apixaban 2.5 MG Tabs tablet  Commonly known as:  ELIQUIS  Take 1 tablet (2.5 mg total) by mouth 2 (two) times daily.     docusate sodium 100 MG capsule  Commonly known as:  COLACE  Take 1 capsule (100 mg total) by mouth 2 (two) times daily.     HYDROcodone-acetaminophen 5-325 MG per tablet  Commonly known as:  NORCO  Take 1-2 tablets by mouth every 4 (four)  hours as needed for moderate pain.     methocarbamol 500 MG tablet  Commonly known as:  ROBAXIN  Take 1 tablet (500 mg total) by mouth every 6 (six) hours as needed for muscle spasms.     ondansetron 8 MG disintegrating tablet  Commonly known as:  ZOFRAN ODT  Take 1 tablet (8 mg total) by mouth every 8 (eight) hours as needed for nausea or vomiting.     OxyCODONE 10 mg T12a 12 hr tablet  Commonly known as:  OXYCONTIN  Take 1 tablet (10 mg total) by mouth PRO. 1 tab PO every 12 hours for 3 days, then 1 tab PO daily for 4 days     senna 8.6 MG Tabs tablet  Commonly known as:  SENOKOT  Take 2 tablets (17.2 mg total) by mouth at bedtime.        Diagnostic Studies: Dg Knee Left Port  01/18/2015   CLINICAL DATA:  Status post left knee replacement  EXAM: PORTABLE LEFT KNEE - 1-2 VIEW  COMPARISON:  None.  FINDINGS: The left knee demonstrates a total knee arthroplasty without  evidence of hardware failure complication. There is no significant joint effusion. There is no fracture or dislocation. The alignment is anatomic. Surgical drains are present. Post-surgical changes noted in the surrounding soft tissues.  IMPRESSION: Left total knee arthroplasty.   Electronically Signed   By: Kathreen Devoid   On: 01/18/2015 13:21    Disposition: 06-Home-Health Care Svc  Discharge Instructions    Call MD / Call 911    Complete by:  As directed   If you experience chest pain or shortness of breath, CALL 911 and be transported to the hospital emergency room.  If you develope a fever above 101 F, pus (white drainage) or increased drainage or redness at the wound, or calf pain, call your surgeon's office.     Call MD / Call 911    Complete by:  As directed   If you experience chest pain or shortness of breath, CALL 911 and be transported to the hospital emergency room.  If you develope a fever above 101 F, pus (white drainage) or increased drainage or redness at the wound, or calf pain, call your surgeon's office.     Change dressing    Complete by:  As directed   Change dressing daily with sterile 4 x 4 inch gauze dressing and apply TED hose. Do not submerge the incision under water.     Constipation Prevention    Complete by:  As directed   Drink plenty of fluids.  Prune juice may be helpful.  You may use a stool softener, such as Colace (over the counter) 100 mg twice a day.  Use MiraLax (over the counter) for constipation as needed.     Constipation Prevention    Complete by:  As directed   Drink plenty of fluids.  Prune juice may be helpful.  You may use a stool softener, such as Colace (over the counter) 100 mg twice a day.  Use MiraLax (over the counter) for constipation as needed.     Diet - low sodium heart healthy    Complete by:  As directed      Diet - low sodium heart healthy    Complete by:  As directed      Discharge instructions    Complete by:  As directed   Pick  up stool softner and laxative for home use following surgery while on pain  medications. DO NOT REMOVE DRESSING May shower starting after discharge. Please use a clean towel to pat the dressing dry following showers. Continue to use ice for pain and swelling after surgery.  Eliquis twice a day at home.  Postoperative Constipation Protocol  Constipation - defined medically as fewer than three stools per week and severe constipation as less than one stool per week.  One of the most common issues patients have following surgery is constipation.  Even if you have a regular bowel pattern at home, your normal regimen is likely to be disrupted due to multiple reasons following surgery.  Combination of anesthesia, postoperative narcotics, change in appetite and fluid intake all can affect your bowels.  In order to avoid complications following surgery, here are some recommendations in order to help you during your recovery period.  Colace (docusate) - Pick up an over-the-counter form of Colace or another stool softener and take twice a day as long as you are requiring postoperative pain medications.  Take with a full glass of water daily.  If you experience loose stools or diarrhea, hold the colace until you stool forms back up.  If your symptoms do not get better within 1 week or if they get worse, check with your doctor.  Dulcolax (bisacodyl) - Pick up over-the-counter and take as directed by the product packaging as needed to assist with the movement of your bowels.  Take with a full glass of water.  Use this product as needed if not relieved by Colace only.   MiraLax (polyethylene glycol) - Pick up over-the-counter to have on hand.  MiraLax is a solution that will increase the amount of water in your bowels to assist with bowel movements.  Take as directed and can mix with a glass of water, juice, soda, coffee, or tea.  Take if you go more than two days without a movement. Do not use MiraLax more than  once per day. Call your doctor if you are still constipated or irregular after using this medication for 7 days in a row.  If you continue to have problems with postoperative constipation, please contact the office for further assistance and recommendations.  If you experience "the worst abdominal pain ever" or develop nausea or vomiting, please contact the office immediatly for further recommendations for treatment..     Do not put a pillow under the knee. Place it under the heel.    Complete by:  As directed      Do not sit on low chairs, stoools or toilet seats, as it may be difficult to get up from low surfaces    Complete by:  As directed      Driving restrictions    Complete by:  As directed   No driving until released by the physician.     Increase activity slowly as tolerated    Complete by:  As directed      Increase activity slowly as tolerated    Complete by:  As directed      Lifting restrictions    Complete by:  As directed   No lifting until released by the physician.     Patient may shower    Complete by:  As directed   You may shower without a dressing once there is no drainage.  Do not wash over the wound.  If drainage remains, do not shower until drainage stops.     TED hose    Complete by:  As directed   Use  stockings (TED hose) for 3 weeks on both leg(s).  You may remove them at night for sleeping.     Weight bearing as tolerated    Complete by:  As directed   Laterality:  left  Extremity:  Lower           Follow-up Information    Follow up with Giorgi Debruin, Horald Pollen, MD. Schedule an appointment as soon as possible for a visit in 2 weeks.   Specialty:  Orthopedic Surgery   Why:  For wound re-check   Contact information:   [address] [place] [address] [phone]       Follow up with [facility].   Why:  Home health physical therapy   Contact information:   [address] [place] [address] [phone]         Signed: Elie Goody 01/29/2015, 8:14 AM

## 2015-01-18 NOTE — Anesthesia Postprocedure Evaluation (Signed)
  Anesthesia Post-op Note  Patient: Elizabeth Mcclure  Procedure(s) Performed: Procedure(s) (LRB): LEFT TOTAL KNEE ARTHROPLASTY (Left)  Patient Location: PACU  Anesthesia Type: Spinal  Level of Consciousness: awake and alert   Airway and Oxygen Therapy: Patient Spontanous Breathing  Post-op Pain: mild  Post-op Assessment: Post-op Vital signs reviewed, Patient's Cardiovascular Status Stable, Respiratory Function Stable, Patent Airway and No signs of Nausea or vomiting  Last Vitals:  Filed Vitals:   01/18/15 1400  BP: 113/64  Pulse: 66  Temp: 36.7 C  Resp: 14    Post-op Vital Signs: stable   Complications: No apparent anesthesia complications

## 2015-01-18 NOTE — Interval H&P Note (Signed)
History and Physical Interval Note:  01/18/2015 7:26 AM  Elizabeth Mcclure  has presented today for surgery, with the diagnosis of LEFT KNEE OA  The various methods of treatment have been discussed with the patient and family. After consideration of risks, benefits and other options for treatment, the patient has consented to  Procedure(s): LEFT TOTAL KNEE ARTHROPLASTY (Left) as a surgical intervention .  The patient's history has been reviewed, patient examined, no change in status, stable for surgery.  I have reviewed the patient's chart and labs.  Questions were answered to the patient's satisfaction.     Feliciano Wynter, Horald Pollen

## 2015-01-18 NOTE — Op Note (Signed)
OPERATIVE REPORT  SURGEON: Rod Can, MD   ASSISTANT: Roberto Scales, RNFA.  PREOPERATIVE DIAGNOSIS: Left knee arthritis.   POSTOPERATIVE DIAGNOSIS: Left knee arthritis.   PROCEDURE: Left total knee arthroplasty.   IMPLANTS: Stryker Triathlon PS femur, size 5. Stryker universal tibia, size 4 with 12 x 50 mm stem extender. X3 polyethelyene insert, size 19 mm, PS. 3 button asymmetric patella, size 35 mm. Simplex P bone cement.  ANESTHESIA:  Spinal  TOURNIQUET TIME: 55 min at 364mm Hg.  ESTIMATED BLOOD LOSS: 750 mL.  ANTIBIOTICS: 3g ancef.  DRAINS: Medium HV x1.  COMPLICATIONS: None   CONDITION: PACU - hemodynamically stable.   BRIEF CLINICAL NOTE: Elizabeth Mcclure is a 49 y.o. female with a long-standing history of Left knee arthritis. After failing conservative management, the patient was indicated for total knee arthroplasty. The risks, benefits, and alternatives to the procedure were explained, and the patient elected to proceed.  PROCEDURE IN DETAIL: Spinal anesthesia was obtained in the pre-op holding area. Once inside the operative room, a foley catheter was inserted. The patient was then positioned, a nonsterile tourniquet was placed, and the lower extremity was prepped and draped in the normal sterile surgical fashion. A time-out was called verifying side and site of surgery. The patient received IV antibiotics within 60 minutes of beginning the procedure.   An anterior approach to the knee was performed utilizing a midvastus arthrotomy. A medial release was performed and the patellar fat pad was excised. The cruciates were dissected off the notch. A 4-degree distal femur valgus cut was made with an intramedullary guide. A freehand patellar resection was performed, and the patella was sized an prepared with 3 lug holes.  The proximal tibial resection was performed using an intramedullary guide. The menisci were excised. A spacer block was placed, and the  alignment and balance in extension were examined. Her lateral ligaments were lax due to her deformity.   The distal femur was sized using the 3-degree external rotation guide referencing the posterior femoral cortex. The appropriate 4-in-1 cutting block was pinned into place, and the remaining femoral cuts were performed, taking care to protect the MCL. The box was cut.  The tibia was sized and the trial tray was pinned into place. The remaining trail components were inserted. She had coronal imbalance due to loose lateral ligaments, so I placed 3.22mm cortical screws x2 in the lateral distal femoral surface to improve her balance. The trial femoral component was replaced. The knee was stable to varus and valgus stress through a full range of motion. The patella tracked centrally, and he flexion and extension gaps were well matched. The trial components were removed, and the proximal tibial surface was prepared. A cement restrictor was placed into the tibia. Small drill holes were made in the sclerotic subchondral bone.The cut bony surfaces were irrigated with pulse lavage. Final components were cemented into place and excess cement was cleared. The trial insert was placed, and the knee was brought into extension while the cement polymerized. Once the cement was hard, the knee was tested for a final time and found to be well balanced. The trial insert was exchanged for the real polyethylene insert.  The wound was copiously irrigated with a dilute betadine solution followed by normal saline with pulse lavage. Marcaine solution was injected into the periarticular soft tissue. The wound was closed in layers using #1 Vicryl and V-Loc for the fascia, 2-0 Vicryl for the subcutaneous fat, 2-0 Monocryl for the deep dermal layer, 3-0  running Monocryl subcuticular Stitch, and Dermabond for the skin. Once the glue was fully dried, an Aquacell Ag and compressive dressing were applied. The tourniquet was let down, and  the patient was transported to the recovery room in stable ondition. Sponge, needle, and instrument counts were correct at the end of the case x2. The patient tolerated the procedure well and there were no known complications.

## 2015-01-18 NOTE — Progress Notes (Signed)
Utilization review completed.  

## 2015-01-18 NOTE — Anesthesia Preprocedure Evaluation (Addendum)
Anesthesia Evaluation  Patient identified by MRN, date of birth, ID band Patient awake    Reviewed: Allergy & Precautions, NPO status , Patient's Chart, lab work & pertinent test results  Airway Mallampati: II  TM Distance: >3 FB Neck ROM: Full    Dental no notable dental hx.    Pulmonary Current Smoker,  breath sounds clear to auscultation  Pulmonary exam normal       Cardiovascular negative cardio ROS Normal cardiovascular examRhythm:Regular Rate:Normal     Neuro/Psych negative neurological ROS  negative psych ROS   GI/Hepatic negative GI ROS, Neg liver ROS,   Endo/Other  Morbid obesity  Renal/GU negative Renal ROS  negative genitourinary   Musculoskeletal negative musculoskeletal ROS (+)   Abdominal   Peds negative pediatric ROS (+)  Hematology negative hematology ROS (+)   Anesthesia Other Findings   Reproductive/Obstetrics negative OB ROS                          Anesthesia Physical Anesthesia Plan  ASA: III  Anesthesia Plan: Spinal   Post-op Pain Management:    Induction:   Airway Management Planned: Simple Face Mask  Additional Equipment:   Intra-op Plan:   Post-operative Plan:   Informed Consent: I have reviewed the patients History and Physical, chart, labs and discussed the procedure including the risks, benefits and alternatives for the proposed anesthesia with the patient or authorized representative who has indicated his/her understanding and acceptance.   Dental advisory given  Plan Discussed with: CRNA  Anesthesia Plan Comments:         Anesthesia Quick Evaluation

## 2015-01-18 NOTE — H&P (View-Only) (Signed)
TOTAL KNEE ADMISSION H&P  Patient is being admitted for left total knee arthroplasty.  Subjective:  Chief Complaint:left knee pain.  HPI: Elizabeth Mcclure, 49 y.o. female, has a history of pain and functional disability in the left knee due to arthritis and has failed non-surgical conservative treatments for greater than 12 weeks to includeNSAID's and/or analgesics, corticosteriod injections, viscosupplementation injections, flexibility and strengthening excercises, use of assistive devices, weight reduction as appropriate and activity modification.  Onset of symptoms was gradual, starting >10 years ago with gradually worsening course since that time. The patient noted prior procedures on the knee to include  arthroscopy on the left knee(s).  Patient currently rates pain in the left knee(s) at 10 out of 10 with activity. Patient has night pain, worsening of pain with activity and weight bearing, pain that interferes with activities of daily living, pain with passive range of motion, crepitus, joint swelling and instability.  Patient has evidence of subchondral cysts, subchondral sclerosis, periarticular osteophytes, joint subluxation and joint space narrowing by imaging studies. There is no active infection.  Patient Active Problem List   Diagnosis Date Noted  . Cervical adenopathy 05/13/2012  . Encounter for screening colonoscopy 05/13/2012   Past Medical History  Diagnosis Date  . Joint pain   . Carpal tunnel syndrome   . Constipation     Past Surgical History  Procedure Laterality Date  . Vein closure      X2, needs vein stripping  . Left knee arthroscopy    . Colonoscopy  05/24/2012    Procedure: COLONOSCOPY;  Surgeon: Danie Binder, MD;  Location: [facility];  Service: Endoscopy;  Laterality: N/A;  9:30  . Cholecystectomy    . Dilation and curettage of uterus       (Not in a hospital admission) Allergies  Allergen Reactions  . Benadryl [Diphenhydramine] Hives  .  Penicillins     History  Substance Use Topics  . Smoking status: Current Every Day Smoker -- 0.25 packs/day for 10.5 years    Types: Cigarettes  . Smokeless tobacco: Not on file     Comment: Quit smoking x 13 years  . Alcohol Use: Yes     Comment: 2-3 drinks a year    Family History  Problem Relation Age of Onset  . Colon cancer Mother 14    passed away this year, 10/13/2011, day after Mother's day.      Review of Systems  Constitutional: Positive for fever, chills, malaise/fatigue and diaphoresis.  HENT: Negative.   Eyes: Negative.   Respiratory: Positive for wheezing.   Cardiovascular: Negative.   Gastrointestinal: Negative.   Genitourinary: Negative.   Musculoskeletal: Positive for myalgias and joint pain.  Neurological: Negative.   Endo/Heme/Allergies: Negative.   Psychiatric/Behavioral: Negative.     Objective:  Physical Exam  Constitutional: She is oriented to person, place, and time. She appears well-developed and well-nourished.  HENT:  Head: Normocephalic and atraumatic.  Eyes: Conjunctivae and EOM are normal. Pupils are equal, round, and reactive to light.  Neck: Normal range of motion. Neck supple.  Cardiovascular: Normal rate, regular rhythm, normal heart sounds and intact distal pulses.   Respiratory: Effort normal and breath sounds normal. No respiratory distress.  GI: Soft. Bowel sounds are normal. She exhibits no distension. There is no tenderness.  Genitourinary:  deferred  Musculoskeletal:       Left knee: She exhibits deformity. Tenderness found. Medial joint line tenderness noted.  ROM 10-95 degrees  Neurological: She is alert and  oriented to person, place, and time. She has normal reflexes.  Skin: Skin is warm and dry.  Psychiatric: She has a normal mood and affect. Her behavior is normal. Judgment and thought content normal.    Vital signs in last 24 hours: @VSRANGES @  Labs:   Estimated body mass index is 41.03 kg/(m^2) as calculated from the  following:   Height as of 05/24/12: 5\' 7"  (1.702 m).   Weight as of 11/07/14: 118.842 kg (262 lb).   Imaging Review Plain radiographs demonstrate severe degenerative joint disease of the left knee(s). The overall alignment issignificant varus. The bone quality appears to be adequate for age and reported activity level.  Assessment/Plan:  End stage arthritis, left knee   The patient history, physical examination, clinical judgment of the provider and imaging studies are consistent with end stage degenerative joint disease of the left knee(s) and total knee arthroplasty is deemed medically necessary. The treatment options including medical management, injection therapy arthroscopy and arthroplasty were discussed at length. The risks and benefits of total knee arthroplasty were presented and reviewed. The risks due to aseptic loosening, infection, stiffness, patella tracking problems, thromboembolic complications and other imponderables were discussed. The patient acknowledged the explanation, agreed to proceed with the plan and consent was signed. Patient is being admitted for inpatient treatment for surgery, pain control, PT, OT, prophylactic antibiotics, VTE prophylaxis, progressive ambulation and ADL's and discharge planning. The patient is planning to be discharged home vs rehab depending on social support

## 2015-01-18 NOTE — Transfer of Care (Signed)
Immediate Anesthesia Transfer of Care Note  Patient: Elizabeth Mcclure  Procedure(s) Performed: Procedure(s): LEFT TOTAL KNEE ARTHROPLASTY (Left)  Patient Location: PACU  Anesthesia Type:MAC and Spinal  Level of Consciousness: awake, alert , oriented and patient cooperative  Airway & Oxygen Therapy: Patient Spontanous Breathing and Patient connected to face mask oxygen  Post-op Assessment: Report given to RN and Post -op Vital signs reviewed and stable  Post vital signs: Reviewed and stable  Last Vitals:  Filed Vitals:   01/18/15 0542  BP: 133/81  Pulse: 70  Temp: 36.9 C  Resp: 18    Complications: No apparent anesthesia complications

## 2015-01-18 NOTE — Anesthesia Procedure Notes (Addendum)
Spinal Patient location during procedure: OR Staffing Anesthesiologist: Montez Hageman Performed by: anesthesiologist  Preanesthetic Checklist Completed: patient identified, site marked, surgical consent, pre-op evaluation, timeout performed, IV checked, risks and benefits discussed and monitors and equipment checked Spinal Block Patient position: sitting Prep: Betadine Patient monitoring: heart rate, continuous pulse ox and blood pressure Approach: midline Location: L3-4 Injection technique: single-shot Needle Needle type: Spinocan  Needle gauge: 22 G Needle length: 9 cm Additional Notes Expiration date of kit checked and confirmed.  Attempt by CRNA x1 with 24 Sprotte. Attempt by me x2. Switch to 22g cutting needle. Moderate scoliosis noted. >2cm to left of midline for success.    Patient tolerated procedure well, without complications.    Procedure Name: MAC Date/Time: 01/18/2015 7:28 AM Performed by: Carleene Cooper A Pre-anesthesia Checklist: Patient identified, Timeout performed, Emergency Drugs available, Suction available and Patient being monitored Patient Re-evaluated:Patient Re-evaluated prior to inductionOxygen Delivery Method: Simple face mask Dental Injury: Teeth and Oropharynx as per pre-operative assessment

## 2015-01-19 ENCOUNTER — Encounter (HOSPITAL_COMMUNITY): Payer: Self-pay | Admitting: Orthopedic Surgery

## 2015-01-19 LAB — BASIC METABOLIC PANEL
Anion gap: 4 — ABNORMAL LOW (ref 5–15)
BUN: 15 mg/dL (ref 6–20)
CO2: 24 mmol/L (ref 22–32)
Calcium: 8 mg/dL — ABNORMAL LOW (ref 8.9–10.3)
Chloride: 110 mmol/L (ref 101–111)
Creatinine, Ser: 0.83 mg/dL (ref 0.44–1.00)
GFR calc Af Amer: >60 mL/min (ref >60)
GFR calc non Af Amer: >60 mL/min (ref >60)
Glucose, Bld: 123 mg/dL — ABNORMAL HIGH (ref 65–99)
POTASSIUM: 4.3 mmol/L (ref 3.5–5.1)
Sodium: 138 mmol/L (ref 135–145)

## 2015-01-19 LAB — CBC
HCT: 35.8 % — ABNORMAL LOW (ref 36.0–46.0)
Hemoglobin: 11.4 g/dL — ABNORMAL LOW (ref 12.0–15.0)
MCH: 27.7 pg (ref 26.0–34.0)
MCHC: 31.8 g/dL (ref 30.0–36.0)
MCV: 86.9 fL (ref 78.0–100.0)
PLATELETS: 142 10*3/uL — AB (ref 150–400)
RBC: 4.12 MIL/uL (ref 3.87–5.11)
RDW: 13.5 % (ref 11.5–15.5)
WBC: 12.9 10*3/uL — ABNORMAL HIGH (ref 4.0–10.5)

## 2015-01-19 MED ORDER — METHOCARBAMOL 500 MG PO TABS: 500.0000 mg | ORAL_TABLET | Freq: Four times a day (QID) | ORAL | Status: DC | PRN

## 2015-01-19 MED ORDER — SODIUM CHLORIDE 0.9 % IV BOLUS (SEPSIS)
500.0000 mL | Freq: Once | INTRAVENOUS | Status: AC
  Administered 2015-01-19: 500 mL via INTRAVENOUS

## 2015-01-19 NOTE — Evaluation (Signed)
Occupational Therapy Evaluation Patient Details Name: Elizabeth Mcclure MRN: [id number] DOB: 04-22-1966 Today's Date: 01/19/2015    History of Present Illness L TKR   Clinical Impression   This 49 year old female was admitted for the above surgery.  She was able to perform SPT with min guard A but has decreased standing tolerance due to nausea.  Will follow in acute with supervision level goals for ambulating to bathroom and standing for grooming.  She will have 24/7 assistance at home.    Follow Up Recommendations  Supervision/Assistance - 24 hour;No OT follow up    Equipment Recommendations  3 in 1 bedside comode    Recommendations for Other Services       Precautions / Restrictions Precautions Precautions: Knee;Fall Restrictions Other Position/Activity Restrictions: WBAT      Mobility Bed Mobility               General bed mobility comments: oob  Transfers   Equipment used: Rolling walker (2 wheeled) Transfers: Sit to/from [employer] to Stand: Min guard Stand pivot transfers: Min guard       General transfer comment: for safety    Balance                                            ADL Overall ADL's : Needs assistance/impaired     Grooming: Wash/dry hands;Wash/dry face;Set up;Sitting   Upper Body Bathing: Set up;Sitting   Lower Body Bathing: Minimal assistance;Sit to/from stand   Upper Body Dressing : Set up;Sitting   Lower Body Dressing: Moderate assistance;Sit to/from stand   Toilet Transfer: Min guard;Stand-pivot;BSC;RW             General ADL Comments: pt performed SPT to 3:1 commode.  Washed and donned clean gown from there.  Pt got nauseous when standing; repositioned in chair and brought her a coke and cool cloth: she did not want to call for nausea medication at this time.  She has reach to don pants and can lift leg against gravity. Pt plans to wear slip on shoes. Brother/friend available to  assist as needed  She plans to sponge bathe at his house as there is only a 1/2 bath on the first floor     Vision     Perception     Praxis      Pertinent Vitals/Pain Pain Score: 6  Pain Location: L knee thigh and ankle Pain Descriptors / Indicators: Aching Pain Intervention(s): Limited activity within patient's tolerance;Monitored during session;Premedicated before session;Repositioned (pt declines ice, increases her pain)     Hand Dominance     Extremity/Trunk Assessment Upper Extremity Assessment Upper Extremity Assessment: Overall WFL for tasks assessed           Communication Communication Communication: No difficulties   Cognition Arousal/Alertness: Awake/alert Behavior During Therapy: WFL for tasks assessed/performed Overall Cognitive Status: Within Functional Limits for tasks assessed                     General Comments       Exercises       Shoulder Instructions      Home Living Family/patient expects to be discharged to:: Private residence Living Arrangements: Children;Other relatives Available Help at Discharge: Family                   Bathroom Toilet:  Standard         Additional Comments: 1/2 bath downstairs.  Pt is staying at brother's house      Prior Functioning/Environment Level of Independence: Independent             OT Diagnosis: Acute pain;Generalized weakness   OT Problem List: Decreased activity tolerance;Decreased knowledge of use of DME or AE;Impaired UE functional use   OT Treatment/Interventions: Self-care/ADL training;DME and/or AE instruction;Patient/family education    OT Goals(Current goals can be found in the care plan section) Acute Rehab OT Goals Patient Stated Goal: walk with less pain OT Goal Formulation: With patient Time For Goal Achievement: 01/26/15 Potential to Achieve Goals: Good ADL Goals Pt Will Perform Grooming: with supervision;standing Pt Will Transfer to Toilet: with  supervision;ambulating;bedside commode  OT Frequency: Min 2X/week   Barriers to D/C:            Co-evaluation              End of Session    Activity Tolerance:  (limited by nausea) Patient left: in chair;with call bell/phone within reach   Time: 3154-0086 OT Time Calculation (min): 32 min Charges:  OT General Charges $OT Visit: 1 Procedure OT Evaluation $Initial OT Evaluation Tier I: 1 Procedure OT Treatments $Self Care/Home Management : 8-22 mins G-Codes:    Cline Draheim 02-04-2015, 4:01 PM  Lesle Chris, OTR/L [phone] 2015-02-04

## 2015-01-19 NOTE — Progress Notes (Signed)
PT Cancellation Note  Patient Details Name: Elizabeth Mcclure MRN: [id number] DOB: 1966/01/27   Cancelled Treatment:     PT deferred this pm 2* ongoing nausea with activity.  Will follow in am.   Baker Kogler 01/19/2015, 4:03 PM

## 2015-01-19 NOTE — Evaluation (Addendum)
Physical Therapy Evaluation Patient Details Name: Elizabeth Mcclure MRN: [id number] DOB: [birth year] Today's Date: 01/19/2015   History of Present Illness  L TKR  Clinical Impression  Pt s/p L TKR presents with decreased L LE strength/ROM and post op pain limiting functional mobility.  Pt should progress to dc home with assist of family and HHPT follow up.    Follow Up Recommendations Home health PT    Equipment Recommendations  Rolling walker with 5" wheels    Recommendations for Other Services OT consult     Precautions / Restrictions Precautions Precautions: Knee;Fall Restrictions Weight Bearing Restrictions: No Other Position/Activity Restrictions: WBAT      Mobility  Bed Mobility Overal bed mobility: Needs Assistance Bed Mobility: Supine to Sit     Supine to sit: Min assist     General bed mobility comments: cues for sequence and use of R LE to self assist  Transfers Overall transfer level: Needs assistance Equipment used: Rolling walker (2 wheeled) Transfers: Sit to/from Stand Sit to Stand: Min assist         General transfer comment: cues for LE management and use of UEs to self assist  Ambulation/Gait Ambulation/Gait assistance: Min assist Ambulation Distance (Feet): 8 Feet Assistive device: Rolling walker (2 wheeled) Gait Pattern/deviations: Step-to pattern;Decreased step length - right;Decreased step length - left;Shuffle;Trunk flexed Gait velocity: decr   General Gait Details: cues for sequence, posture and position from [employer]            Wheelchair Mobility    Modified Rankin (Stroke Patients Only)       Balance                                             Pertinent Vitals/Pain Pain Assessment: 0-10 Pain Score: 6  Pain Location: L knee/thigh Pain Descriptors / Indicators: Aching;Sore Pain Intervention(s): Limited activity within patient's tolerance;Monitored during session;Premedicated before session;Ice  applied    Home Living Family/patient expects to be discharged to:: Private residence Living Arrangements: Children;Other relatives Available Help at Discharge: Family Type of Home: Apartment Home Access: Ramped entrance     Home Layout: Able to live on main level with bedroom/bathroom Home Equipment: None Additional Comments: Going to brother's home    Prior Function Level of Independence: Independent               Hand Dominance        Extremity/Trunk Assessment   Upper Extremity Assessment: Overall WFL for tasks assessed           Lower Extremity Assessment: LLE deficits/detail   LLE Deficits / Details: 3/5 quads with pt performing IND SLR, AAROM at L knee -8 - 40  Cervical / Trunk Assessment: Normal  Communication   Communication: No difficulties  Cognition Arousal/Alertness: Awake/alert Behavior During Therapy: WFL for tasks assessed/performed Overall Cognitive Status: Within Functional Limits for tasks assessed                      General Comments      Exercises Total Joint Exercises Ankle Circles/Pumps: AROM;Both;15 reps;Supine Quad Sets: AROM;Both;10 reps;Supine Heel Slides: AAROM;10 reps;Supine;Left Straight Leg Raises: AAROM;AROM;Left;10 reps;Supine      Assessment/Plan    PT Assessment Patient needs continued PT services  PT Diagnosis Difficulty walking   PT Problem List Decreased strength;Decreased range of motion;Decreased activity tolerance;Decreased mobility;Decreased knowledge  of use of DME;Obesity;Pain  PT Treatment Interventions DME instruction;Gait training;Stair training;Functional mobility training;Therapeutic activities;Therapeutic exercise;Patient/family education   PT Goals (Current goals can be found in the Care Plan section) Acute Rehab PT Goals Patient Stated Goal: walk with less pain PT Goal Formulation: With patient Time For Goal Achievement: 01/24/15 Potential to Achieve Goals: Good    Frequency 7X/week    Barriers to discharge        Co-evaluation               End of Session Equipment Utilized During Treatment: Gait belt Activity Tolerance: Other (comment) (dizziness/nausea - BP 133/75) Patient left: in chair;with call bell/phone within reach Nurse Communication: Mobility status         Time: 3154-0086 PT Time Calculation (min) (ACUTE ONLY): 43 min   Charges:   PT Evaluation $Initial PT Evaluation Tier I: 1 Procedure PT Treatments $Therapeutic Exercise: 8-22 mins  $Gait 8-22 mins   PT G Codes:        Ziair Penson 2015/02/15, 12:43 PM

## 2015-01-19 NOTE — Progress Notes (Addendum)
01/19/15  1623  Called [phone] to have Dian Situ, PA or Dr Lyla Glassing Paged regarding patients decrease urine output today. Waiting for call back.  Dian Situ called back and the plan is to give NS 577mL bolus over 2hrs. Okay to leave foley in, but foley must be removed 01/20/15 in the am. Encourage patient to push fluids.

## 2015-01-19 NOTE — Discharge Instructions (Signed)
°Dr. Brian Swinteck °Total Joint Specialist °[facility] °[address] °[place], [place] [address] °[phone] ° °TOTAL KNEE REPLACEMENT POSTOPERATIVE DIRECTIONS ° ° ° °Knee Rehabilitation, Guidelines Following Surgery  °Results after knee surgery are often greatly improved when you follow the exercise, range of motion and muscle strengthening exercises prescribed by your doctor. Safety measures are also important to protect the knee from further injury. Any time any of these exercises cause you to have increased pain or swelling in your knee joint, decrease the amount until you are comfortable again and slowly increase them. If you have problems or questions, call your caregiver or physical therapist for advice.  ° °WEIGHT BEARING °Weight bearing as tolerated with assist device (walker, cane, etc) as directed, use it as long as suggested by your surgeon or therapist, typically at least 4-6 weeks. ° °HOME CARE INSTRUCTIONS  °Remove items at home which could result in a fall. This includes throw rugs or furniture in walking pathways.  °Continue medications as instructed at time of discharge. °You may have some home medications which will be placed on hold until you complete the course of blood thinner medication.  °You may start showering once you are discharged home but do not submerge the incision under water. Just pat the incision dry and apply a dry gauze dressing on daily. °Walk with walker as instructed.  °You may resume a sexual relationship in one month or when given the OK by your doctor.  °· Use walker as long as suggested by your caregivers. °· Avoid periods of inactivity such as sitting longer than an hour when not asleep. This helps prevent blood clots.  °You may put full weight on your legs and walk as much as is comfortable.  °You may return to work once you are cleared by your doctor.  °Do not drive a car for 6 weeks or until released by you surgeon.  °· Do not drive while  taking narcotics.  °Wear the elastic stockings for three weeks following surgery during the day but you may remove then at night. °Make sure you keep all of your appointments after your operation with all of your doctors and caregivers. You should call the office at the above phone number and make an appointment for approximately two weeks after the date of your surgery. °Do not remove your surgical dressing. The dressing is waterproof; you may take showers in 3 days, but do not take tub baths or submerge the dressing. °Please pick up a stool softener and laxative for home use as long as you are requiring pain medications. °· ICE to the affected knee every three hours for 30 minutes at a time and then as needed for pain and swelling.  Continue to use ice on the knee for pain and swelling from surgery. You may notice swelling that will progress down to the foot and ankle.  This is normal after surgery.  Elevate the leg when you are not up walking on it.   °It is important for you to complete the blood thinner medication as prescribed by your doctor. °· Continue to use the breathing machine which will help keep your temperature down.  It is common for your temperature to cycle up and down following surgery, especially at night when you are not up moving around and exerting yourself.  The breathing machine keeps your lungs expanded and your temperature down. ° °RANGE OF MOTION AND STRENGTHENING EXERCISES  °Rehabilitation of the knee is important following a   knee injury or an operation. After just a few days of immobilization, the muscles of the thigh which control the knee become weakened and shrink (atrophy). Knee exercises are designed to build up the tone and strength of the thigh muscles and to improve knee motion. Often times heat used for twenty to thirty minutes before working out will loosen up your tissues and help with improving the range of motion but do not use heat for the first two weeks following  surgery. These exercises can be done on a training (exercise) mat, on the floor, on a table or on a bed. Use what ever works the best and is most comfortable for you Knee exercises include:  Leg Lifts - While your knee is still immobilized in a splint or cast, you can do straight leg raises. Lift the leg to 60 degrees, hold for 3 sec, and slowly lower the leg. Repeat 10-20 times 2-3 times daily. Perform this exercise against resistance later as your knee gets better.  Quad and Hamstring Sets - Tighten up the muscle on the front of the thigh (Quad) and hold for 5-10 sec. Repeat this 10-20 times hourly. Hamstring sets are done by pushing the foot backward against an object and holding for 5-10 sec. Repeat as with quad sets.  A rehabilitation program following serious knee injuries can speed recovery and prevent re-injury in the future due to weakened muscles. Contact your doctor or a physical therapist for more information on knee rehabilitation.   SKILLED REHAB INSTRUCTIONS: If the patient is transferred to a skilled rehab facility following release from the hospital, a list of the current medications will be sent to the facility for the patient to continue.  When discharged from the skilled rehab facility, please have the facility set up the patient's [place] prior to being released. Also, the skilled facility will be responsible for providing the patient with their medications at time of release from the facility to include their pain medication, the muscle relaxants, and their blood thinner medication. If the patient is still at the rehab facility at time of the two week follow up appointment, the skilled rehab facility will also need to assist the patient in arranging follow up appointment in our office and any transportation needs.  MAKE SURE YOU:  Understand these instructions.  Will watch your condition.  Will get help right away if you are not doing well or get worse.     Pick up stool softner and laxative for home use following surgery while on pain medications. Do NOT remove your dressing. You may shower.  Do not take tub baths or submerge incision under water. May shower starting three days after surgery. Please use a clean towel to pat the incision dry following showers. Continue to use ice for pain and swelling after surgery. Do not use any lotions or creams on the incision until instructed by your surgeon. ===================================================================== Information on my medicine - ELIQUIS (apixaban)  This medication education was reviewed with me or my healthcare representative as part of my discharge preparation.  The pharmacist that spoke with me during my hospital stay was:  Jhony Antrim, Julieta Bellini, RPH  Why was Eliquis prescribed for you? Eliquis was prescribed for you to reduce the risk of blood clots forming after orthopedic surgery.    What do You need to know about Eliquis? Take your Eliquis TWICE DAILY - one tablet in the morning and one tablet in the evening with or without food.  It would be best to take the dose about the same time each day.  If you have difficulty swallowing the tablet whole please discuss with your pharmacist how to take the medication safely.  Take Eliquis exactly as prescribed by your doctor and DO NOT stop taking Eliquis without talking to the doctor who prescribed the medication.  Stopping without other medication to take the place of Eliquis may increase your risk of developing a clot.  After discharge, you should have regular check-up appointments with your healthcare provider that is prescribing your Eliquis.  What do you do if you miss a dose? If a dose of ELIQUIS is not taken at the scheduled time, take it as soon as possible on the same day and twice-daily administration should be resumed.  The dose should not be doubled to make up for a missed dose.  Do not take more than one  tablet of ELIQUIS at the same time.  Important Safety Information A possible side effect of Eliquis is bleeding. You should call your healthcare provider right away if you experience any of the following: ? Bleeding from an injury or your nose that does not stop. ? Unusual colored urine (red or dark brown) or unusual colored stools (red or black). ? Unusual bruising for unknown reasons. ? A serious fall or if you hit your head (even if there is no bleeding).  Some medicines may interact with Eliquis and might increase your risk of bleeding or clotting while on Eliquis. To help avoid this, consult your healthcare provider or pharmacist prior to using any new prescription or non-prescription medications, including herbals, vitamins, non-steroidal anti-inflammatory drugs (NSAIDs) and supplements.  This website has more information on Eliquis (apixaban): http://www.eliquis.com/eliquis/home

## 2015-01-19 NOTE — Progress Notes (Signed)
   Subjective: 1 Day Post-Op Procedure(s) (LRB): LEFT TOTAL KNEE ARTHROPLASTY (Left) Patient reports pain as moderate and severe.   Patient seen in rounds for Dr. Lyla Glassing. Patient is having problems with pain in the knee, requiring pain medications We will start therapy today. Just dangled on the side of the bed Plan is to go Home after hospital stay.  Objective: Vital signs in last 24 hours: Temp:  [97.6 F (36.4 C)-98.3 F (36.8 C)] 98.2 F (36.8 C) (07/15 0721) Pulse Rate:  [57-71] 71 (07/15 0721) Resp:  [12-18] 16 (07/15 0721) BP: (107-131)/(53-83) 107/73 mmHg (07/15 0721) SpO2:  [99 %-100 %] 100 % (07/15 0721) Weight:  [127.914 kg (282 lb)] 127.914 kg (282 lb) (07/14 1400)  Intake/Output from previous day:  Intake/Output Summary (Last 24 hours) at 01/19/15 0904 Last data filed at 01/19/15 5956  Gross per 24 hour  Intake   4275 ml  Output   1530 ml  Net   2745 ml    Intake/Output this shift: Total I/O In: 240 [P.O.:240] Out: 100 [Urine:100]  Labs:  Recent Labs  01/19/15 0429  HGB 11.4*    Recent Labs  01/19/15 0429  WBC 12.9*  RBC 4.12  HCT 35.8*  PLT 142*    Recent Labs  01/19/15 0429  NA 138  K 4.3  CL 110  CO2 24  BUN 15  CREATININE 0.83  GLUCOSE 123*  CALCIUM 8.0*   No results for input(s): LABPT, INR in the last 72 hours.  EXAM General - Patient is Alert and Appropriate Extremity - Neurovascular intact Sensation intact distally Dorsiflexion/Plantar flexion intact Dressing - dressing C/D/I Motor Function - intact, moving foot and toes well on exam.  Hemovac pulled without difficulty.  Past Medical History  Diagnosis Date  . Joint pain   . Carpal tunnel syndrome   . Constipation   . Varicose veins of right leg with edema     pt states has had vein closure twice in this leg saw Dr Early 5 years ago pt states has not seen since edema decreases with elevation   . Pneumonia     hx of twice  . GERD (gastroesophageal reflux  disease)   . Arthritis   . 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   Weight as of this encounter: 127.914 kg (282 lb). Advance diet Up with therapy Discharge home with home health probably over the weekend, Saturday if she does well, but may be Sunday.  If does not progress, then may have to look into other options but plan is to go home at this time.  DVT Prophylaxis - Eliquis twice a day Weight-Bearing as tolerated to left leg D/C O2 and Pulse OX and try on Room Air  Arlee Muslim, PA-C Orthopaedic Surgery 01/19/2015, 9:04 AM

## 2015-01-20 LAB — CBC
HCT: 32.9 % — ABNORMAL LOW (ref 36.0–46.0)
HEMOGLOBIN: 10.5 g/dL — AB (ref 12.0–15.0)
MCH: 28.2 pg (ref 26.0–34.0)
MCHC: 31.9 g/dL (ref 30.0–36.0)
MCV: 88.2 fL (ref 78.0–100.0)
Platelets: 121 10*3/uL — ABNORMAL LOW (ref 150–400)
RBC: 3.73 MIL/uL — ABNORMAL LOW (ref 3.87–5.11)
RDW: 13.8 % (ref 11.5–15.5)
WBC: 7.3 10*3/uL (ref 4.0–10.5)

## 2015-01-20 NOTE — Care Management Note (Signed)
Case Management Note  Patient Details  Name: Elizabeth Mcclure MRN: [id number] Date of Birth: 11-Nov-1965  Subjective/Objective:                  LEFT TOTAL KNEE ARTHROPLASTY (Left)  Action/Plan:  Discharge planning  Expected Discharge Date:                  Expected Discharge Plan:  [place]  In-House Referral:     Discharge planning Services  CM Consult  Post Acute Care Choice:  Home Health Choice offered to:  Patient  DME Arranged:  3-N-1, Walker rolling DME Agency:  [place]:  PT [place]:  [place]  Status of Service:  Completed, signed off  Medicare Important Message Given:    Date Medicare IM Given:    Medicare IM give by:    Date Additional Medicare IM Given:    Additional Medicare Important Message give by:     If discussed at [place] of Stay Meetings, dates discussed:    Additional Comments:  CM spoke with patient. [facility] set-up pre-operatively for HHPT. Patient is agreeable. Needs a RW and 3N1. Jermaine at [facility] notified of DME request.   Apolonio Schneiders, RN 01/20/2015, 10:41 AM

## 2015-01-20 NOTE — Progress Notes (Signed)
Occupational Therapy Treatment Patient Details Name: Elizabeth Mcclure MRN: [id number] DOB: [birth year] Today's Date: 01/20/2015    History of present illness L TKR   OT comments  Next session: walk to BR   Follow Up Recommendations  Home health OT;Supervision/Assistance - 24 hour    Equipment Recommendations  3 in 1 bedside comode    Recommendations for Other Services      Precautions / Restrictions Precautions Precautions: Knee;Fall Restrictions Other Position/Activity Restrictions: WBAT       Mobility Bed Mobility               General bed mobility comments: NT  Transfers                 General transfer comment: NT    Balance                                   ADL                                         General ADL Comments: Pt in bed complaining about being up in chair too long yesterday, the food and pain meds not working.  OT listened to pts concerns and encouraged her to speak with director.  OT redirected pt to discussing ADL activity . Pt has been on [facility] and verbalized safety. Pt already has one for home. Educated on use of AE. Pt would like to try next OT sesison but does not want to get OOB at this time      Vision                     Perception     Praxis      Cognition   Behavior During Therapy: [facility] for tasks assessed/performed Overall Cognitive Status: Within Functional Limits for tasks assessed                       Extremity/Trunk Assessment               Exercises     Shoulder Instructions       General Comments      Pertinent Vitals/ Pain       Pain Score: 6  Pain Location: L knee Pain Descriptors / Indicators: Sore;Aching Pain Intervention(s): Monitored during session;Limited activity within patient's tolerance;Repositioned;Patient requesting pain meds-RN notified  Home Living                                          Prior  Functioning/Environment              Frequency Min 2X/week     Progress Toward Goals  OT Goals(current goals can now be found in the care plan section)  Progress towards OT goals: Not progressing toward goals - comment (pts pain limiting)     Plan Discharge plan remains appropriate    Co-evaluation                 End of Session     Activity Tolerance Patient limited by pain   Patient Left in bed;with call bell/phone within reach;with family/visitor present   Nurse Communication Mobility status  Time: 1210-1218 OT Time Calculation (min): 8 min  Charges: OT General Charges $OT Visit: 1 Procedure OT Treatments $Self Care/Home Management : 8-22 mins  Yomayra Tate, Thereasa Parkin 01/20/2015, 12:21 PM

## 2015-01-20 NOTE — Progress Notes (Signed)
Subjective: 2 Days Post-Op Procedure(s) (LRB): LEFT TOTAL KNEE ARTHROPLASTY (Left)  Patient reports pain as mild to moderate.  C/o of mild N this morning, but has an appetite for breakfast.  Admits to eating and having BM.  States that if she feels well she would like to go home today.  Objective:   VITALS:  Temp:  [97.9 F (36.6 C)-98.3 F (36.8 C)] 98.1 F (36.7 C) (07/16 0553) Pulse Rate:  [64-81] 71 (07/16 0553) Resp:  [16-18] 16 (07/16 0553) BP: (113-125)/(58-71) 113/63 mmHg (07/16 0553) SpO2:  [100 %] 100 % (07/16 0553)  Neurologically intact  Neurovascularly intact Sensation intact to light touch Able to DF/PF her ankle. No cellulitis Can perform quad set. Abdomen soft   LABS  Recent Labs  01/19/15 0429 01/20/15 0526  HGB 11.4* 10.5*  WBC 12.9* 7.3  PLT 142* 121*    Recent Labs  01/19/15 0429  NA 138  K 4.3  CL 110  CO2 24  BUN 15  CREATININE 0.83  GLUCOSE 123*   No results for input(s): LABPT, INR in the last 72 hours.   Assessment/Plan: 2 Days Post-Op Procedure(s) (LRB): LEFT TOTAL KNEE ARTHROPLASTY (Left)  Up with therapy  Patient encouraged to eat to help with nausea Planned D/C home today or tomorrow. Discontinue saline lock  Mechele Claude, PA-C, [place] Office:  [phone]

## 2015-01-20 NOTE — Plan of Care (Signed)
Problem: Consults Goal: Diagnosis- Total Joint Replacement Outcome: Completed/Met Date Met:  01/20/15 Primary Total Knee LEFT

## 2015-01-20 NOTE — Progress Notes (Signed)
Physical Therapy Treatment Patient Details Name: Elizabeth Mcclure MRN: [id number] DOB: 09-01-1965 Today's Date: 01/20/2015    History of Present Illness L TKR    PT Comments    Pt mildly orthostatic this pm but progressing with mobility. BP supine 116/65, sit 93/72' stand 98/60, and after ambulating 117/73.    Follow Up Recommendations  Home health PT     Equipment Recommendations  Rolling walker with 5" wheels    Recommendations for Other Services OT consult     Precautions / Restrictions Precautions Precautions: Knee;Fall Restrictions Weight Bearing Restrictions: No Other Position/Activity Restrictions: WBAT    Mobility  Bed Mobility Overal bed mobility: Needs Assistance Bed Mobility: Supine to Sit;Sit to Supine     Supine to sit: Min guard Sit to supine: Min assist   General bed mobility comments: min assist with L LE back to bed  Transfers Overall transfer level: Needs assistance Equipment used: Rolling walker (2 wheeled) Transfers: Sit to/from Stand Sit to Stand: Min guard         General transfer comment: cues for LE management and use of UEs to self assist  Ambulation/Gait Ambulation/Gait assistance: Min assist Ambulation Distance (Feet): 68 Feet Assistive device: Rolling walker (2 wheeled) Gait Pattern/deviations: Step-to pattern;Decreased step length - right;Decreased step length - left;Shuffle;Trunk flexed Gait velocity: decr   General Gait Details: cues for sequence, posture and position from [employer]            Wheelchair Mobility    Modified Rankin (Stroke Patients Only)       Balance                                    Cognition Arousal/Alertness: Awake/alert Behavior During Therapy: WFL for tasks assessed/performed Overall Cognitive Status: Within Functional Limits for tasks assessed                      Exercises Total Joint Exercises Ankle Circles/Pumps: AROM;Both;15 reps;Supine Quad Sets:  AROM;Both;10 reps;Supine Heel Slides: AAROM;10 reps;Supine;Left Straight Leg Raises: AAROM;AROM;Left;10 reps;Supine Goniometric ROM: AAROM L knee -8 - 35 - pain ltd with muscle guarding    General Comments        Pertinent Vitals/Pain Pain Assessment: 0-10 Pain Score: 6  Pain Location: L knee Pain Descriptors / Indicators: Aching;Sore Pain Intervention(s): Limited activity within patient's tolerance;Monitored during session;Premedicated before session;Ice applied    Home Living                      Prior Function            PT Goals (current goals can now be found in the care plan section) Acute Rehab PT Goals Patient Stated Goal: walk with less pain PT Goal Formulation: With patient Time For Goal Achievement: 01/24/15 Potential to Achieve Goals: Good Progress towards PT goals: Progressing toward goals    Frequency  7X/week    PT Plan Current plan remains appropriate    Co-evaluation             End of Session Equipment Utilized During Treatment: Gait belt Activity Tolerance: Patient tolerated treatment well Patient left: in bed;with call bell/phone within reach     Time: 1535-1620 PT Time Calculation (min) (ACUTE ONLY): 45 min  Charges:  $Gait Training: 23-37 mins $Therapeutic Exercise: 8-22 mins  G Codes:      Infinity Jeffords 02/17/2015, 5:34 PM

## 2015-01-21 LAB — CBC
HEMATOCRIT: 31 % — AB (ref 36.0–46.0)
Hemoglobin: 9.8 g/dL — ABNORMAL LOW (ref 12.0–15.0)
MCH: 28 pg (ref 26.0–34.0)
MCHC: 31.6 g/dL (ref 30.0–36.0)
MCV: 88.6 fL (ref 78.0–100.0)
Platelets: 130 10*3/uL — ABNORMAL LOW (ref 150–400)
RBC: 3.5 MIL/uL — AB (ref 3.87–5.11)
RDW: 13.8 % (ref 11.5–15.5)
WBC: 7.1 10*3/uL (ref 4.0–10.5)

## 2015-01-21 NOTE — Progress Notes (Signed)
Subjective: 3 Days Post-Op Procedure(s) (LRB): LEFT TOTAL KNEE ARTHROPLASTY (Left) Patient reports pain as 3 on 0-10 scale.    Objective: Vital signs in last 24 hours: Temp:  [97.8 F (36.6 C)-99.4 F (37.4 C)] 98.7 F (37.1 C) (07/17 0425) Pulse Rate:  [68-79] 77 (07/17 0425) Resp:  [16-20] 20 (07/17 0425) BP: (100-109)/(42-74) 109/74 mmHg (07/17 0425) SpO2:  [100 %] 100 % (07/17 0425)  Intake/Output from previous day: 07/16 0701 - 07/17 0700 In: 900 [P.O.:900] Out: 600 [Urine:600] Intake/Output this shift:     Recent Labs  01/19/15 0429 01/20/15 0526 01/21/15 0444  HGB 11.4* 10.5* 9.8*    Recent Labs  01/20/15 0526 01/21/15 0444  WBC 7.3 7.1  RBC 3.73* 3.50*  HCT 32.9* 31.0*  PLT 121* 130*    Recent Labs  01/19/15 0429  NA 138  K 4.3  CL 110  CO2 24  BUN 15  CREATININE 0.83  GLUCOSE 123*  CALCIUM 8.0*   No results for input(s): LABPT, INR in the last 72 hours.  Neurologically intact Sensation intact distally Dorsiflexion/Plantar flexion intact Incision: dressing C/D/I No cellulitis present Compartment soft  Assessment/Plan: 3 Days Post-Op Procedure(s) (LRB): LEFT TOTAL KNEE ARTHROPLASTY (Left) Advance diet Up with therapy Discharge home with home health  Xitlalic Maslin C 01/21/2015, 7:54 AM

## 2015-01-21 NOTE — Progress Notes (Signed)
Physical Therapy Treatment Patient Details Name: Elizabeth Mcclure MRN: [id number] DOB: 06-23-1966 Today's Date: Jan 23, 2015    History of Present Illness L TKR    PT Comments    Progressing, incr knee flexion today after stretching  Follow Up Recommendations  Home health PT     Equipment Recommendations  Rolling walker with 5" wheels    Recommendations for Other Services       Precautions / Restrictions Precautions Precautions: Knee;Fall Restrictions Other Position/Activity Restrictions: WBAT    Mobility  Bed Mobility Overal bed mobility: Modified Independent Bed Mobility: Supine to Sit;Sit to Supine              Transfers Overall transfer level: Needs assistance Equipment used: Rolling walker (2 wheeled) Transfers: Sit to/from Stand Sit to Stand: Supervision         General transfer comment: cues fro positioning LLE, pt self corrects for hadn placement  Ambulation/Gait Ambulation/Gait assistance: Min guard;Supervision Ambulation Distance (Feet): 60 Feet Assistive device: Rolling walker (2 wheeled) Gait Pattern/deviations: Step-to pattern;Antalgic;Trunk flexed     General Gait Details: cues for sequence, posture and position from [employer]:  (no stairs at home)          Wheelchair Mobility    Modified Rankin (Stroke Patients Only)       Balance                                    Cognition Arousal/Alertness: Awake/alert Behavior During Therapy: WFL for tasks assessed/performed Overall Cognitive Status: Within Functional Limits for tasks assessed                      Exercises Total Joint Exercises Ankle Circles/Pumps: AROM;Both;15 reps;Supine Quad Sets: AROM;Both;10 reps;Supine Heel Slides: AAROM;10 reps;Supine;Left Hip ABduction/ADduction: AROM;Left;10 reps Straight Leg Raises: AROM;Left;10 reps Goniometric ROM: grossly 8 to 50* flexion    General Comments        Pertinent Vitals/Pain Pain  Assessment: 0-10 Pain Score: 7  Pain Location: L knee Pain Descriptors / Indicators: Aching Pain Intervention(s): Limited activity within patient's tolerance;Monitored during session;Premedicated before session;Repositioned;Ice applied    Home Living                      Prior Function            PT Goals (current goals can now be found in the care plan section) Acute Rehab PT Goals Patient Stated Goal: walk with less pain PT Goal Formulation: With patient Time For Goal Achievement: 01/24/15 Potential to Achieve Goals: Good Progress towards PT goals: Progressing toward goals    Frequency  7X/week    PT Plan Current plan remains appropriate    Co-evaluation             End of Session   Activity Tolerance: Patient tolerated treatment well Patient left: in bed;with call bell/phone within reach     Time: 0950-1020 PT Time Calculation (min) (ACUTE ONLY): 30 min  Charges:  $Gait Training: 8-22 mins $Therapeutic Exercise: 8-22 mins                    G Codes:      Elizabeth Mcclure Jan 23, 2015, 10:19 AM

## 2015-02-11 ENCOUNTER — Emergency Department (HOSPITAL_COMMUNITY)
Admission: EM | Admit: 2015-02-11 | Discharge: 2015-02-11 | Disposition: A | Discharge status: Home / Self Care | Payer: BC Managed Care – PPO | Attending: Emergency Medicine | Admitting: Emergency Medicine

## 2015-02-11 ENCOUNTER — Encounter (HOSPITAL_COMMUNITY): Payer: Self-pay | Admitting: Emergency Medicine

## 2015-02-11 DIAGNOSIS — M1990 Unspecified osteoarthritis, unspecified site: Secondary | ICD-10-CM | POA: Insufficient documentation

## 2015-02-11 DIAGNOSIS — Z7902 Long term (current) use of antithrombotics/antiplatelets: Secondary | ICD-10-CM | POA: Diagnosis not present

## 2015-02-11 DIAGNOSIS — Z9089 Acquired absence of other organs: Secondary | ICD-10-CM | POA: Insufficient documentation

## 2015-02-11 DIAGNOSIS — Z880 Allergy status to penicillin: Secondary | ICD-10-CM | POA: Insufficient documentation

## 2015-02-11 DIAGNOSIS — R0781 Pleurodynia: Secondary | ICD-10-CM | POA: Diagnosis not present

## 2015-02-11 DIAGNOSIS — Z79899 Other long term (current) drug therapy: Secondary | ICD-10-CM | POA: Insufficient documentation

## 2015-02-11 DIAGNOSIS — R110 Nausea: Secondary | ICD-10-CM | POA: Insufficient documentation

## 2015-02-11 DIAGNOSIS — R109 Unspecified abdominal pain: Secondary | ICD-10-CM | POA: Diagnosis not present

## 2015-02-11 DIAGNOSIS — Z720 Tobacco use: Secondary | ICD-10-CM | POA: Diagnosis not present

## 2015-02-11 DIAGNOSIS — Z8679 Personal history of other diseases of the circulatory system: Secondary | ICD-10-CM | POA: Insufficient documentation

## 2015-02-11 DIAGNOSIS — K5900 Constipation, unspecified: Secondary | ICD-10-CM | POA: Diagnosis not present

## 2015-02-11 DIAGNOSIS — Z9889 Other specified postprocedural states: Secondary | ICD-10-CM | POA: Insufficient documentation

## 2015-02-11 DIAGNOSIS — Z8669 Personal history of other diseases of the nervous system and sense organs: Secondary | ICD-10-CM | POA: Diagnosis not present

## 2015-02-11 DIAGNOSIS — R1012 Left upper quadrant pain: Secondary | ICD-10-CM | POA: Diagnosis present

## 2015-02-11 DIAGNOSIS — Z8701 Personal history of pneumonia (recurrent): Secondary | ICD-10-CM | POA: Insufficient documentation

## 2015-02-11 LAB — CBC WITH DIFFERENTIAL/PLATELET
BASOS PCT: 0 % (ref 0–1)
Basophils Absolute: 0 10*3/uL (ref 0.0–0.1)
Eosinophils Absolute: 0.2 10*3/uL (ref 0.0–0.7)
Eosinophils Relative: 2 % (ref 0–5)
HCT: 38.9 % (ref 36.0–46.0)
Hemoglobin: 12.3 g/dL (ref 12.0–15.0)
LYMPHS PCT: 20 % (ref 12–46)
Lymphs Abs: 1.5 10*3/uL (ref 0.7–4.0)
MCH: 27.3 pg (ref 26.0–34.0)
MCHC: 31.6 g/dL (ref 30.0–36.0)
MCV: 86.3 fL (ref 78.0–100.0)
Monocytes Absolute: 0.4 10*3/uL (ref 0.1–1.0)
Monocytes Relative: 5 % (ref 3–12)
Neutro Abs: 5.3 10*3/uL (ref 1.7–7.7)
Neutrophils Relative %: 73 % (ref 43–77)
PLATELETS: 263 10*3/uL (ref 150–400)
RBC: 4.51 MIL/uL (ref 3.87–5.11)
RDW: 13.9 % (ref 11.5–15.5)
WBC: 7.4 10*3/uL (ref 4.0–10.5)

## 2015-02-11 LAB — COMPREHENSIVE METABOLIC PANEL
ALK PHOS: 78 U/L (ref 38–126)
ALT: 11 U/L — ABNORMAL LOW (ref 14–54)
AST: 19 U/L (ref 15–41)
Albumin: 3.5 g/dL (ref 3.5–5.0)
Anion gap: 8 (ref 5–15)
BILIRUBIN TOTAL: 0.2 mg/dL — AB (ref 0.3–1.2)
BUN: 10 mg/dL (ref 6–20)
CALCIUM: 9.2 mg/dL (ref 8.9–10.3)
CO2: 27 mmol/L (ref 22–32)
CREATININE: 0.84 mg/dL (ref 0.44–1.00)
Chloride: 105 mmol/L (ref 101–111)
GFR calc Af Amer: >60 mL/min (ref >60)
GFR calc non Af Amer: >60 mL/min (ref >60)
GLUCOSE: 87 mg/dL (ref 65–99)
POTASSIUM: 3.9 mmol/L (ref 3.5–5.1)
SODIUM: 140 mmol/L (ref 135–145)
TOTAL PROTEIN: 6.8 g/dL (ref 6.5–8.1)

## 2015-02-11 LAB — I-STAT CG4 LACTIC ACID, ED: Lactic Acid, Venous: 1.43 mmol/L (ref 0.5–2.0)

## 2015-02-11 LAB — LIPASE, BLOOD: Lipase: 16 U/L — ABNORMAL LOW (ref 22–51)

## 2015-02-11 MED ORDER — ONDANSETRON HCL 4 MG/2ML IJ SOLN: 4.0000 mg | Freq: Once | INTRAMUSCULAR | Status: DC

## 2015-02-11 MED ORDER — HYDROCODONE-ACETAMINOPHEN 5-325 MG PO TABS: 1.0000 | ORAL_TABLET | Freq: Once | ORAL | Status: DC

## 2015-02-11 MED ORDER — KETOROLAC TROMETHAMINE 30 MG/ML IJ SOLN: 30.0000 mg | Freq: Once | INTRAMUSCULAR | Status: DC

## 2015-02-11 MED ORDER — ACETAMINOPHEN 500 MG PO TABS: 1000.0000 mg | ORAL_TABLET | Freq: Once | ORAL | Status: DC

## 2015-02-11 MED ORDER — SODIUM CHLORIDE 0.9 % IV BOLUS (SEPSIS): 1000.0000 mL | Freq: Once | INTRAVENOUS | Status: DC

## 2015-02-11 NOTE — Discharge Instructions (Signed)
Abdominal Pain Many things can cause abdominal pain. Usually, abdominal pain is not caused by a disease and will improve without treatment. It can often be observed and treated at home. Your health care provider will do a physical exam and possibly order blood tests and X-rays to help determine the seriousness of your pain. However, in many cases, more time must pass before a clear cause of the pain can be found. Before that point, your health care provider may not know if you need more testing or further treatment. HOME CARE INSTRUCTIONS  Monitor your abdominal pain for any changes. The following actions may help to alleviate any discomfort you are experiencing:  Only take over-the-counter or prescription medicines as directed by your health care provider.  Do not take laxatives unless directed to do so by your health care provider.  Try a clear liquid diet (broth, tea, or water) as directed by your health care provider. Slowly move to a bland diet as tolerated. SEEK MEDICAL CARE IF:  You have unexplained abdominal pain.  You have abdominal pain associated with nausea or diarrhea.  You have pain when you urinate or have a bowel movement.  You experience abdominal pain that wakes you in the night.  You have abdominal pain that is worsened or improved by eating food.  You have abdominal pain that is worsened with eating fatty foods.  You have a fever. SEEK IMMEDIATE MEDICAL CARE IF:   Your pain does not go away within 2 hours.  You keep throwing up (vomiting).  Your pain is felt only in portions of the abdomen, such as the right side or the left lower portion of the abdomen.  You pass bloody or black tarry stools. MAKE SURE YOU:  Understand these instructions.   Will watch your condition.   Will get help right away if you are not doing well or get worse.  Document Released: 04/02/2005 Document Revised: 06/28/2013 Document Reviewed: 03/02/2013 [facility] Patient Information  2015 [place], [place]. This information is not intended to replace advice given to you by your health care provider. Make sure you discuss any questions you have with your health care provider. Musculoskeletal Pain Musculoskeletal pain is muscle and boney aches and pains. These pains can occur in any part of the body. Your caregiver may treat you without knowing the cause of the pain. They may treat you if blood or urine tests, X-rays, and other tests were normal.  CAUSES There is often not a definite cause or reason for these pains. These pains may be caused by a type of germ (virus). The discomfort may also come from overuse. Overuse includes working out too hard when your body is not fit. Boney aches also come from weather changes. Bone is sensitive to atmospheric pressure changes. HOME CARE INSTRUCTIONS   Ask when your test results will be ready. Make sure you get your test results.  Only take over-the-counter or prescription medicines for pain, discomfort, or fever as directed by your caregiver. If you were given medications for your condition, do not drive, operate machinery or power tools, or sign legal documents for 24 hours. Do not drink alcohol. Do not take sleeping pills or other medications that may interfere with treatment.  Continue all activities unless the activities cause more pain. When the pain lessens, slowly resume normal activities. Gradually increase the intensity and duration of the activities or exercise.  During periods of severe pain, bed rest may be helpful. Lay or sit in any position that  is comfortable.  Putting ice on the injured area.  Put ice in a bag.  Place a towel between your skin and the bag.  Leave the ice on for 15 to 20 minutes, 3 to 4 times a day.  Follow up with your caregiver for continued problems and no reason can be found for the pain. If the pain becomes worse or does not go away, it may be necessary to repeat tests or do additional testing. Your  caregiver may need to look further for a possible cause. SEEK IMMEDIATE MEDICAL CARE IF:  You have pain that is getting worse and is not relieved by medications.  You develop chest pain that is associated with shortness or breath, sweating, feeling sick to your stomach (nauseous), or throw up (vomit).  Your pain becomes localized to the abdomen.  You develop any new symptoms that seem different or that concern you. MAKE SURE YOU:   Understand these instructions.  Will watch your condition.  Will get help right away if you are not doing well or get worse. Document Released: 06/23/2005 Document Revised: 09/15/2011 Document Reviewed: 02/25/2013 [facility] Patient Information 2015 [place], [place]. This information is not intended to replace advice given to you by your health care provider. Make sure you discuss any questions you have with your health care provider.

## 2015-02-11 NOTE — ED Provider Notes (Signed)
CSN: [id number]     Arrival date & time 02/11/15  1009 History   First MD Initiated Contact with Patient 02/11/15 1036     Chief Complaint  Patient presents with  . Abdominal Pain     (Consider location/radiation/quality/duration/timing/severity/associated sxs/prior Treatment) Patient is a 49 y.o. female presenting with abdominal pain.  Abdominal Pain Pain location:  LUQ Pain quality: aching   Pain radiates to:  Does not radiate Pain severity:  Moderate Onset quality:  Gradual Duration:  1 day Timing:  Constant Progression:  Waxing and waning Chronicity:  New Context: previous surgery (knee replacement 1 month ago)   Context: not trauma   Relieved by:  Nothing Worsened by:  Nothing tried Ineffective treatments:  None tried Associated symptoms: nausea   Associated symptoms: no diarrhea, no fever, no shortness of breath and no vomiting     Past Medical History  Diagnosis Date  . Joint pain   . Carpal tunnel syndrome   . Constipation   . Varicose veins of right leg with edema     pt states has had vein closure twice in this leg saw Dr Early 5 years ago pt states has not seen since edema decreases with elevation   . Pneumonia     hx of twice  . GERD (gastroesophageal reflux disease)   . Arthritis   . Idiopathic chronic venous hypertension of right leg w/o complications     hx of    Past Surgical History  Procedure Laterality Date  . Vein closure      X2, needs vein stripping  . Left knee arthroscopy    . Colonoscopy  05/24/2012    Procedure: COLONOSCOPY;  Surgeon: Danie Binder, MD;  Location: [facility];  Service: Endoscopy;  Laterality: N/A;  9:30  . Cholecystectomy    . Dilation and curettage of uterus    . Venous duplex       hx of   . Stab phlebectomy       02/2008  . Ablation right great sapheous vein       02/09/2008  . Total knee arthroplasty Left 01/18/2015    Procedure: LEFT TOTAL KNEE ARTHROPLASTY;  Surgeon: Rod Can, MD;  Location: [facility];   Service: Orthopedics;  Laterality: Left;   Family History  Problem Relation Age of Onset  . Colon cancer Mother 51    passed away this year, 26-Oct-2011, day after Mother's day.    History  Substance Use Topics  . Smoking status: Current Every Day Smoker -- 0.50 packs/day for 3 years    Types: Cigarettes  . Smokeless tobacco: Never Used     Comment: Quit smoking x 13 years  . Alcohol Use: Yes     Comment: 2-3 drinks a year   OB History    No data available     Review of Systems  Constitutional: Negative for fever.  Respiratory: Negative for shortness of breath.   Gastrointestinal: Positive for nausea and abdominal pain. Negative for vomiting and diarrhea.  All other systems reviewed and are negative.     Allergies  Benadryl and Penicillins  Home Medications   Prior to Admission medications   Medication Sig Start Date End Date Taking? Authorizing Provider  apixaban (ELIQUIS) 2.5 MG TABS tablet Take 1 tablet (2.5 mg total) by mouth 2 (two) times daily. 01/18/15   Rod Can, MD  docusate sodium (COLACE) 100 MG capsule Take 1 capsule (100 mg total) by mouth 2 (two) times daily. 01/18/15  Rod Can, MD  HYDROcodone-acetaminophen (NORCO) 5-325 MG per tablet Take 1-2 tablets by mouth every 4 (four) hours as needed for moderate pain. 01/18/15   Rod Can, MD  methocarbamol (ROBAXIN) 500 MG tablet Take 1 tablet (500 mg total) by mouth every 6 (six) hours as needed for muscle spasms. 01/19/15   Arlee Muslim, PA-C  ondansetron (ZOFRAN ODT) 8 MG disintegrating tablet Take 1 tablet (8 mg total) by mouth every 8 (eight) hours as needed for nausea or vomiting. 01/18/15   Rod Can, MD  OxyCODONE (OXYCONTIN) 10 mg T12A 12 hr tablet Take 1 tablet (10 mg total) by mouth PRO. 1 tab PO every 12 hours for 3 days, then 1 tab PO daily for 4 days 01/18/15   Rod Can, MD  senna (SENOKOT) 8.6 MG TABS tablet Take 2 tablets (17.2 mg total) by mouth at bedtime. 01/18/15   Rod Can, MD    BP 104/68 mmHg  Pulse 94  Temp(Src) 98.6 F (37 C) (Oral)  Resp 18  Ht 5\' 8"  (1.727 m)  Wt 263 lb (119.296 kg)  BMI 40.00 kg/m2  SpO2 98%  LMP 01/23/2015 Physical Exam  Constitutional: She is oriented to person, place, and time. She appears well-developed and well-nourished. No distress.  HENT:  Head: Normocephalic.  Eyes: Conjunctivae are normal.  Neck: Neck supple. No tracheal deviation present.  Cardiovascular: Normal rate and regular rhythm.   Pulmonary/Chest: Effort normal. No respiratory distress. She exhibits tenderness (over left lower rib cage laterally).  Abdominal: Soft. Normal appearance. She exhibits no distension. There is no hepatosplenomegaly. There is tenderness (overlying left oblique muscles). There is no rigidity, no rebound, no guarding, no CVA tenderness and no tenderness at McBurney's point.  Neurological: She is alert and oriented to person, place, and time.  Skin: Skin is warm and dry.  Psychiatric: She has a normal mood and affect.    ED Course  Procedures (including critical care time) Labs Review Labs Reviewed  COMPREHENSIVE METABOLIC PANEL - Abnormal; Notable for the following:    ALT 11 (*)    Total Bilirubin 0.2 (*)    All other components within normal limits  LIPASE, BLOOD - Abnormal; Notable for the following:    Lipase 16 (*)    All other components within normal limits  CBC WITH DIFFERENTIAL/PLATELET  I-STAT CG4 LACTIC ACID, ED    Imaging Review No results found.   EKG Interpretation None      MDM   Final diagnoses:  Left sided abdominal pain of unknown cause    49 year old female presents with insidious onset left upper abdominal pain and tenderness over her rib cage that started last night. It continued to worsen intake today. She is currently on Eliquis anticoagulated following recent knee surgery where she had replacement in her left knee. She was advised not to take anti-inflammatories and refused a single dose of  Toradol here for what appears to be musculoskeletal pain. She has no signs of peritonitis, no significant lab abnormalities, at this point with anticoagulation and lack of frank tachycardia I have very low suspicion for PE. I recommended that she follow closely with her primary care physician or return to emergency Department with any acute worsening in her symptoms.    Leo Grosser, MD 02/11/15 [phone]

## 2015-02-11 NOTE — ED Notes (Signed)
Pt reports L upper abdominal pain. Onset Saturday evening. 5/10 pain at the time. Abdomen soft and nontender to palpation. Bowel sounds present all quadrants. No signs of distress noted.

## 2015-02-11 NOTE — ED Notes (Signed)
Pt refused medications. Resting quietly at the time. Awaiting lab results. No signs of distress noted.

## 2015-02-11 NOTE — ED Notes (Signed)
Pt. Stated, I started having left side abdominal pain since yesterday. It feels like somebody is pulling everything out. Had total kneed replacement on July 14

## 2015-10-24 DIAGNOSIS — M47817 Spondylosis without myelopathy or radiculopathy, lumbosacral region: Secondary | ICD-10-CM | POA: Insufficient documentation

## 2016-02-12 ENCOUNTER — Other Ambulatory Visit: Payer: Self-pay | Admitting: Vascular Surgery

## 2016-02-12 DIAGNOSIS — R600 Localized edema: Secondary | ICD-10-CM

## 2016-03-13 ENCOUNTER — Encounter: Payer: Self-pay | Admitting: Vascular Surgery

## 2016-03-18 ENCOUNTER — Ambulatory Visit (HOSPITAL_COMMUNITY)
Admission: RE | Admit: 2016-03-18 | Discharge: 2016-03-18 | Disposition: A | Discharge status: Home / Self Care | Payer: Medicaid Other | Source: Ambulatory Visit | Attending: Vascular Surgery | Admitting: Vascular Surgery

## 2016-03-18 ENCOUNTER — Ambulatory Visit (INDEPENDENT_AMBULATORY_CARE_PROVIDER_SITE_OTHER): Payer: BC Managed Care – PPO | Admitting: Vascular Surgery

## 2016-03-18 ENCOUNTER — Encounter: Payer: Self-pay | Admitting: Vascular Surgery

## 2016-03-18 VITALS — BP 118/80 | HR 75 | Temp 97.5°F | Resp 18 | Ht 67.5 in | Wt 301.3 lb

## 2016-03-18 DIAGNOSIS — I83893 Varicose veins of bilateral lower extremities with other complications: Secondary | ICD-10-CM

## 2016-03-18 DIAGNOSIS — I8393 Asymptomatic varicose veins of bilateral lower extremities: Secondary | ICD-10-CM | POA: Diagnosis not present

## 2016-03-18 DIAGNOSIS — R609 Edema, unspecified: Secondary | ICD-10-CM | POA: Diagnosis present

## 2016-03-18 DIAGNOSIS — R600 Localized edema: Secondary | ICD-10-CM

## 2016-03-18 NOTE — Progress Notes (Signed)
Patient name: Elizabeth Mcclure MRN: [id number] DOB: 20-Oct-1965 Sex: female  REASON FOR CONSULT: Reevaluation for venous hypertension  HPI: Elizabeth Mcclure is a 50 y.o. female, her today for discussion of pain related to venous hypertension. This is much worse on her right leg and her left leg. She is known to me from a prior attempted ablation of her right great saphenous vein. She had the unusual history of having similar treatment at [facility] in October 10, 2002 with failed closure. She underwent a re-attention at the laser ablation closure of her great saphenous vein by myself and 2007-10-10. She had unsuccessful closure of her vein. That time I offered surgical ligation and stripping. She wished to defer this. She has had progressive changes since that time. She reports now that she has severe swelling and it sometimes difficult for her to even if her right pant leg on and off related to the leg swelling. Portions she is had no history of DVT. She does have difficulty with arthritic changes and had a total knee replacement on the left leg the last year. She reports severe swelling and aching despite elevation when possible. She is morbidly obese and therefore cannot wear compression stockings. She does wrap her leg from her foot up to her thigh with Ace wrap.  Past Medical History:  Diagnosis Date  . Arthritis   . Carpal tunnel syndrome   . Constipation   . GERD (gastroesophageal reflux disease)   . 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 Joint pain   . Pneumonia    hx of twice  . Varicose veins   . Varicose veins of right leg with edema    pt states has had vein closure twice in this leg saw Dr Cacie Gaskins 5 years ago pt states has not seen since edema decreases with elevation     Family History  Problem Relation Age of Onset  . Colon cancer Mother 34    passed away this year, 10-10-2011, day after Mother's day.     SOCIAL HISTORY: Social History    Social History  . Marital status: Married    Spouse name: N/A  . Number of children: N/A  . Years of education: N/A   Occupational History  . Not on file.   Social History Main Topics  . Smoking status: Former Smoker    Packs/day: 0.50    Years: 3.00    Types: Cigarettes    Quit date: 03/10/2016  . Smokeless tobacco: Never Used     Comment: Quit smoking x 13 years  . Alcohol use Yes     Comment: 2-3 drinks a year  . Drug use: No  . Sexual activity: Yes    Partners: Male    Birth control/ protection: None   Other Topics Concern  . Not on file   Social History Narrative  . No narrative on file    Allergies  Allergen Reactions  . Benadryl [Diphenhydramine] Anaphylaxis and Hives  . Penicillins Hives    Current Outpatient Prescriptions  Medication Sig Dispense Refill  . Cetirizine HCl (ZYRTEC ALLERGY PO) Take by mouth daily.    . diazepam (VALIUM) 5 MG tablet Take 5 mg by mouth every 6 (six) hours as needed for anxiety. Takes 1/2 tablet (of 5 mg tablet) as needed.    Marland Kitchen apixaban (ELIQUIS) 2.5 MG TABS tablet Take 1 tablet (2.5 mg total) by mouth 2 (two) times  daily. (Patient not taking: Reported on 03/18/2016) 60 tablet 0  . docusate sodium (COLACE) 100 MG capsule Take 1 capsule (100 mg total) by mouth 2 (two) times daily. (Patient not taking: Reported on 03/18/2016) 60 capsule 3  . HYDROcodone-acetaminophen (NORCO) 5-325 MG per tablet Take 1-2 tablets by mouth every 4 (four) hours as needed for moderate pain. (Patient not taking: Reported on 03/18/2016) 90 tablet 0  . methocarbamol (ROBAXIN) 500 MG tablet Take 1 tablet (500 mg total) by mouth every 6 (six) hours as needed for muscle spasms. (Patient not taking: Reported on 03/18/2016) 80 tablet 0  . ondansetron (ZOFRAN ODT) 8 MG disintegrating tablet Take 1 tablet (8 mg total) by mouth every 8 (eight) hours as needed for nausea or vomiting. (Patient not taking: Reported on 03/18/2016) 20 tablet 0  . OxyCODONE (OXYCONTIN) 10 mg  T12A 12 hr tablet Take 1 tablet (10 mg total) by mouth PRO. 1 tab PO every 12 hours for 3 days, then 1 tab PO daily for 4 days (Patient not taking: Reported on 03/18/2016) 10 tablet 0  . senna (SENOKOT) 8.6 MG TABS tablet Take 2 tablets (17.2 mg total) by mouth at bedtime. (Patient not taking: Reported on 03/18/2016) 60 each 3   No current facility-administered medications for this visit.     REVIEW OF SYSTEMS:  [X]  denotes positive finding, [ ]  denotes negative finding Cardiac  Comments:  Chest pain or chest pressure: x   Shortness of breath upon exertion: x   Short of breath when lying flat:    Irregular heart rhythm:        Vascular    Pain in calf, thigh, or hip brought on by ambulation: x   Pain in feet at night that wakes you up from your sleep:  x   Blood clot in your veins:    Leg swelling:  x       Pulmonary    Oxygen at home:    Productive cough:     Wheezing:         Neurologic    Sudden weakness in arms or legs:     Sudden numbness in arms or legs:     Sudden onset of difficulty speaking or slurred speech:    Temporary loss of vision in one eye:     Problems with dizziness:         Gastrointestinal    Blood in stool:     Vomited blood:         Genitourinary    Burning when urinating:     Blood in urine:        Psychiatric    Major depression:         Hematologic    Bleeding problems:    Problems with blood clotting too easily:        Skin    Rashes or ulcers:        Constitutional    Fever or chills:      PHYSICAL EXAM: Vitals:   03/18/16 1144  BP: 118/80  Pulse: 75  Resp: 18  Temp: 97.5 F (36.4 C)  TempSrc: Oral  SpO2: 96%  Weight: (!) 301 lb 4.8 oz (136.7 kg)  Height: 5' 7.5" (1.715 m)    GENERAL: The patient is a well-nourished female, in no acute distress. The vital signs are documented above. CARDIAC: There is a regular rate and rhythm.  VASCULAR: 2+ radial 2+ dorsalis pedis pulses bilaterally PULMONARY: There is good air exchange  ABDOMEN: Soft and non-tender with normal pitched bowel sounds.  MUSCULOSKELETAL: There are no major deformities or cyanosis. NEUROLOGIC: No focal weakness or paresthesias are detected. SKIN: There are no ulcers or rashes noted. PSYCHIATRIC: The patient has a normal affect. Patient has large [place] over her lateral distal thigh and calf. This is on the right leg.  DATA:   Duplex today shows reflux throughout her great saphenous vein on right. She does have some areas of reflux throughout her saphenous vein on the left with insignificant dilatation.  I imaged this area with SonoSite ultrasound as well. This does extend into the varicosities in her calf.  MEDICAL ISSUES:  Severe symptoms of discomfort and swelling related to venous hypertension. 2 prior failed attempts at ablation of her great saphenous vein. I would not recommend reattempt at this technique. If she wished to proceed with the treatment I would recommend ligation stripping of her great saphenous vein and stab phlebectomy of tributary varicosities for symptom relief. We have re-plan the critical importance of elevation compression. Will continue with this and will see [place] back for further discussion and 3   Aydeen Blume [facility] [employer] [phone]

## 2016-06-11 ENCOUNTER — Encounter: Payer: Self-pay | Admitting: Vascular Surgery

## 2016-06-17 ENCOUNTER — Ambulatory Visit (INDEPENDENT_AMBULATORY_CARE_PROVIDER_SITE_OTHER): Payer: BC Managed Care – PPO | Admitting: Vascular Surgery

## 2016-06-17 ENCOUNTER — Other Ambulatory Visit: Payer: Self-pay

## 2016-06-17 ENCOUNTER — Encounter: Payer: Self-pay | Admitting: Vascular Surgery

## 2016-06-17 VITALS — BP 134/87 | HR 77 | Temp 97.1°F | Resp 18 | Ht 67.5 in | Wt 307.3 lb

## 2016-06-17 DIAGNOSIS — I83893 Varicose veins of bilateral lower extremities with other complications: Secondary | ICD-10-CM

## 2016-06-17 NOTE — Progress Notes (Signed)
Vascular [facility] Specialist of [place]  Patient name: Elizabeth Mcclure MRN: [id number] DOB: [birth year] Sex: female  REASON FOR VISIT: Follow-up right leg venous hypertension related to incompetent saphenous vein.  HPI: Elizabeth Mcclure is a 50 y.o. female with a complex past history regarding her right leg. She had undergone ablation of her right saphenous vein [place]. She had unsuccessful closure and partially 5 years ago underwent laser ablation by myself of her right great saphenous vein. She had incomplete closure of her saphenous vein at that setting. She has had progressive difficulty with pain with prolonged standing and increasingly severe pain related to the varicosities in her medial calf. She has been compliant with her graduated compression and has not had any improvement in this. This does make it difficult for her to continue her activities of daily living. She does have orthopedic issues related to her knees as well and this had left total knee replacement and potentially will undergo a right knee replacement several years in the future.  Past Medical History:  Diagnosis Date  . Arthritis   . Carpal tunnel syndrome   . Constipation   . GERD (gastroesophageal reflux disease)   . 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 Joint pain   . Pneumonia    hx of twice  . Varicose veins   . Varicose veins of right leg with edema    pt states has had vein closure twice in this leg saw Dr Evah Rashid 5 years ago pt states has not seen since edema decreases with elevation     Family History  Problem Relation Age of Onset  . Colon cancer Mother 64    passed away this year, 2011/10/13, day after Mother's day.     SOCIAL HISTORY: Social History  Substance Use Topics  . Smoking status: Former Smoker    Packs/day: 0.50    Years: 3.00    Types: Cigarettes    Quit date: 03/10/2016  .  Smokeless tobacco: Never Used     Comment: Quit smoking x 13 years  . Alcohol use Yes     Comment: 2-3 drinks a year    Allergies  Allergen Reactions  . Benadryl [Diphenhydramine] Anaphylaxis and Hives  . Penicillins Hives    Current Outpatient Prescriptions  Medication Sig Dispense Refill  . Cetirizine HCl (ZYRTEC ALLERGY PO) Take by mouth daily.    . diazepam (VALIUM) 5 MG tablet Take 5 mg by mouth every 6 (six) hours as needed for anxiety. Takes 1/2 tablet (of 5 mg tablet) as needed.    . methocarbamol (ROBAXIN) 500 MG tablet Take 1 tablet (500 mg total) by mouth every 6 (six) hours as needed for muscle spasms. 80 tablet 0  . apixaban (ELIQUIS) 2.5 MG TABS tablet Take 1 tablet (2.5 mg total) by mouth 2 (two) times daily. (Patient not taking: Reported on 06/17/2016) 60 tablet 0  . docusate sodium (COLACE) 100 MG capsule Take 1 capsule (100 mg total) by mouth 2 (two) times daily. (Patient not taking: Reported on 06/17/2016) 60 capsule 3  . HYDROcodone-acetaminophen (NORCO) 5-325 MG per tablet Take 1-2 tablets by mouth every 4 (four) hours as needed for moderate pain. (Patient not taking: Reported on 06/17/2016) 90 tablet 0  . ondansetron (ZOFRAN ODT) 8 MG disintegrating tablet Take 1 tablet (8 mg total) by mouth every 8 (eight) hours as needed for nausea or  vomiting. (Patient not taking: Reported on 06/17/2016) 20 tablet 0  . OxyCODONE (OXYCONTIN) 10 mg T12A 12 hr tablet Take 1 tablet (10 mg total) by mouth PRO. 1 tab PO every 12 hours for 3 days, then 1 tab PO daily for 4 days (Patient not taking: Reported on 06/17/2016) 10 tablet 0  . senna (SENOKOT) 8.6 MG TABS tablet Take 2 tablets (17.2 mg total) by mouth at bedtime. (Patient not taking: Reported on 06/17/2016) 60 each 3   No current facility-administered medications for this visit.     REVIEW OF SYSTEMS:  [X]  denotes positive finding, [ ]  denotes negative finding Cardiac  Comments:  Chest pain or chest pressure:    Shortness of  breath upon exertion:    Short of breath when lying flat:    Irregular heart rhythm:        Vascular    Pain in calf, thigh, or hip brought on by ambulation:    Pain in feet at night that wakes you up from your sleep:     Blood clot in your veins:    Leg swelling:  x         PHYSICAL EXAM: Vitals:   06/17/16 1129  BP: 134/87  Pulse: 77  Resp: 18  Temp: 97.1 F (36.2 C)  TempSrc: Oral  SpO2: 98%  Weight: (!) 307 lb 4.8 oz (139.4 kg)  Height: 5' 7.5" (1.715 m)    GENERAL: The patient is a well-nourished female, in no acute distress. The vital signs are documented above. CARDIOVASCULAR: Palpable dorsalis pedis pulses bilaterally Changes of chronic venous hypertension in her right leg with telangiectasia around her ankle and varicosities which are tender in her medial calf PULMONARY: There is good air exchange  MUSCULOSKELETAL: There are no major deformities or cyanosis. NEUROLOGIC: No focal weakness or paresthesias are detected. SKIN: There are no ulcers or rashes noted. PSYCHIATRIC: The patient has a normal affect.  DATA:  Formal venous duplex from 03/18/2016 was reviewed. Shows of reflux throughout her great saphenous vein on the right with no significant deep venous reflux  MEDICAL ISSUES: Continued to severe venous hypertension related to superficial reflux in her right leg. Unusual situation that she has 2 failures of ablation. Have recommended ligation and stripping in the operating room as an outpatient and stab phlebectomy of multiple tributary varicosities for symptom relief. She understands procedure recovery and wished to proceed as soon as possible    Rosetta Posner, MD FACS [facility] of [facility] Tel [phone] Pager [phone]

## 2016-07-01 ENCOUNTER — Encounter (HOSPITAL_COMMUNITY)
Admission: RE | Admit: 2016-07-01 | Discharge: 2016-07-01 | Disposition: A | Discharge status: Home / Self Care | Payer: BC Managed Care – PPO | Source: Ambulatory Visit | Attending: Vascular Surgery | Admitting: Vascular Surgery

## 2016-07-01 ENCOUNTER — Encounter (HOSPITAL_COMMUNITY): Payer: Self-pay

## 2016-07-01 ENCOUNTER — Other Ambulatory Visit (HOSPITAL_COMMUNITY): Payer: Self-pay | Admitting: *Deleted

## 2016-07-01 DIAGNOSIS — I8311 Varicose veins of right lower extremity with inflammation: Secondary | ICD-10-CM | POA: Insufficient documentation

## 2016-07-01 DIAGNOSIS — Z01812 Encounter for preprocedural laboratory examination: Secondary | ICD-10-CM | POA: Insufficient documentation

## 2016-07-01 HISTORY — DX: Depression, unspecified: F32.A

## 2016-07-01 HISTORY — DX: Major depressive disorder, single episode, unspecified: F32.9

## 2016-07-01 HISTORY — DX: Anxiety disorder, unspecified: F41.9

## 2016-07-01 LAB — HCG, SERUM, QUALITATIVE: PREG SERUM: NEGATIVE

## 2016-07-01 LAB — SURGICAL PCR SCREEN
MRSA, PCR: NEGATIVE
Staphylococcus aureus: NEGATIVE

## 2016-07-01 LAB — CBC
HCT: 44.6 % (ref 36.0–46.0)
Hemoglobin: 14.5 g/dL (ref 12.0–15.0)
MCH: 27.3 pg (ref 26.0–34.0)
MCHC: 32.5 g/dL (ref 30.0–36.0)
MCV: 84 fL (ref 78.0–100.0)
PLATELETS: 124 10*3/uL — AB (ref 150–400)
RBC: 5.31 MIL/uL — AB (ref 3.87–5.11)
RDW: 13.9 % (ref 11.5–15.5)
WBC: 5.4 10*3/uL (ref 4.0–10.5)

## 2016-07-01 NOTE — Progress Notes (Signed)
Pt denies cardiac history, chest pain or sob. Pt is not diabetic. 

## 2016-07-01 NOTE — Pre-Procedure Instructions (Signed)
SHAKEILA MARCHITTO  07/01/2016    Your procedure is scheduled on Wednesday, July 09, 2016 at 10:15 AM.   Report to [facility] Entrance "A" Admitting Office at 8:15 AM.   Call this number if you have problems the morning of surgery: [phone]   Questions prior to day of surgery, please call [phone] between 8 & 4 PM.   Remember:  Do not eat food or drink liquids after midnight Tuesday, 07/27/16  Take these medicines the morning of surgery with A SIP OF WATER: Diazepam (Valium) - if needed  Do not use Aspirin products or NSAIDS (Ibuprofen, Aleve, etc.) 5 days prior to surgery.   Do not wear jewelry, make-up or nail polish.  Do not wear lotions, powders, or perfumes.  Do not shave 48 hours prior to surgery.    Do not bring valuables to the hospital.  [facility] is not responsible for any belongings or valuables.  Contacts, dentures or bridgework may not be worn into surgery.  Leave your suitcase in the car.  After surgery it may be brought to your room.  For patients admitted to the hospital, discharge time will be determined by your treatment team.  Patients discharged the day of surgery will not be allowed to drive home.   Special instructions:  [place] - Preparing for Surgery  Before surgery, you can play an important role.  Because skin is not sterile, your skin needs to be as free of germs as possible.  You can reduce the number of germs on you skin by washing with CHG (chlorahexidine gluconate) soap before surgery.  CHG is an antiseptic cleaner which kills germs and bonds with the skin to continue killing germs even after washing.  Please DO NOT use if you have an allergy to CHG or antibacterial soaps.  If your skin becomes reddened/irritated stop using the CHG and inform your nurse when you arrive at Short Stay.  Do not shave (including legs and underarms) for at least 48 hours prior to the first CHG shower.  You may shave your face.  Please follow  these instructions carefully:   1.  Shower with CHG Soap the night before surgery and the                    morning of Surgery.  2.  If you choose to wash your hair, wash your hair first as usual with your       normal shampoo.  3.  After you shampoo, rinse your hair and body thoroughly to remove the shampoo.  4.  Use CHG as you would any other liquid soap.  You can apply chg directly       to the skin and wash gently with scrungie or a clean washcloth.  5.  Apply the CHG Soap to your body ONLY FROM THE NECK DOWN.        Do not use on open wounds or open sores.  Avoid contact with your eyes, ears, mouth and genitals (private parts).  Wash genitals (private parts) with your normal soap.  6.  Wash thoroughly, paying special attention to the area where your surgery        will be performed.  7.  Thoroughly rinse your body with warm water from the neck down.  8.  DO NOT shower/wash with your normal soap after using and rinsing off       the CHG Soap.  9.  Pat yourself dry  with a clean towel.            10.  Wear clean pajamas.            11.  Place clean sheets on your bed the night of your first shower and do not        sleep with pets.  Day of Surgery  Do not apply any lotions the morning of surgery.  Please wear clean clothes to the hospital.   Please read over the fact sheets that you were given.

## 2016-07-08 ENCOUNTER — Other Ambulatory Visit: Payer: Self-pay

## 2016-07-08 MED ORDER — SODIUM CHLORIDE 0.9 % IV SOLN
1500.0000 mg | INTRAVENOUS | Status: AC
  Filled 2016-07-08: qty 1500

## 2016-07-24 ENCOUNTER — Encounter (HOSPITAL_COMMUNITY): Payer: Self-pay | Admitting: *Deleted

## 2016-07-24 NOTE — Progress Notes (Signed)
Spoke with pt for pre-op call. Pt had PAT done by me on 07/01/16, surgery was rescheduled due to pt not feeling well prior to original surgery date. Pt verified that nothing has changed with her medical and surgical history. Updated her medication list.

## 2016-07-24 NOTE — Anesthesia Preprocedure Evaluation (Addendum)
Anesthesia Evaluation  Patient identified by MRN, date of birth, ID band Patient awake    Reviewed: Allergy & Precautions, NPO status , Patient's Chart, lab work & pertinent test results  Airway Mallampati: I  TM Distance: >3 FB Neck ROM: Full    Dental  (+) Teeth Intact, Dental Advisory Given   Pulmonary former smoker,    breath sounds clear to auscultation       Cardiovascular + Peripheral Vascular Disease   Rhythm:Regular Rate:Normal     Neuro/Psych PSYCHIATRIC DISORDERS Anxiety Depression  Neuromuscular disease    GI/Hepatic Neg liver ROS, GERD  ,  Endo/Other  negative endocrine ROS  Renal/GU negative Renal ROS  negative genitourinary   Musculoskeletal  (+) Arthritis , Osteoarthritis,    Abdominal   Peds negative pediatric ROS (+)  Hematology negative hematology ROS (+)   Anesthesia Other Findings   Reproductive/Obstetrics negative OB ROS                            Lab Results  Component Value Date   WBC 5.4 07/01/2016   HGB 14.5 07/01/2016   HCT 44.6 07/01/2016   MCV 84.0 07/01/2016   PLT 124 (L) 07/01/2016   Lab Results  Component Value Date   CREATININE 0.84 02/11/2015   BUN 10 02/11/2015   NA 140 02/11/2015   K 3.9 02/11/2015   CL 105 02/11/2015   CO2 27 02/11/2015   Lab Results  Component Value Date   INR 0.96 01/12/2015     Anesthesia Physical Anesthesia Plan  ASA: III  Anesthesia Plan: General   Post-op Pain Management:    Induction: Intravenous  Airway Management Planned: LMA  Additional Equipment:   Intra-op Plan:   Post-operative Plan: Extubation in OR  Informed Consent: I have reviewed the patients History and Physical, chart, labs and discussed the procedure including the risks, benefits and alternatives for the proposed anesthesia with the patient or authorized representative who has indicated his/her understanding and acceptance.      Plan Discussed with: CRNA  Anesthesia Plan Comments: (Discussed her history challenging spinal in the past and weight gain since last procedure and will elect to proceed with GA> )       Anesthesia Quick Evaluation

## 2016-07-25 ENCOUNTER — Ambulatory Visit (HOSPITAL_COMMUNITY)
Admission: RE | Admit: 2016-07-25 | Discharge: 2016-07-25 | Disposition: A | Discharge status: Home / Self Care | Payer: BC Managed Care – PPO | Source: Ambulatory Visit | Attending: Vascular Surgery | Admitting: Vascular Surgery

## 2016-07-25 ENCOUNTER — Ambulatory Visit (HOSPITAL_COMMUNITY): Payer: BC Managed Care – PPO | Admitting: Anesthesiology

## 2016-07-25 ENCOUNTER — Encounter (HOSPITAL_COMMUNITY): Payer: Self-pay | Admitting: Certified Registered Nurse Anesthetist

## 2016-07-25 ENCOUNTER — Encounter (HOSPITAL_COMMUNITY)
Admission: RE | Disposition: A | Discharge status: Home / Self Care | Payer: Self-pay | Source: Ambulatory Visit | Attending: Vascular Surgery

## 2016-07-25 ENCOUNTER — Telehealth: Payer: Self-pay | Admitting: Vascular Surgery

## 2016-07-25 DIAGNOSIS — Z96652 Presence of left artificial knee joint: Secondary | ICD-10-CM | POA: Diagnosis not present

## 2016-07-25 DIAGNOSIS — Z87891 Personal history of nicotine dependence: Secondary | ICD-10-CM | POA: Diagnosis not present

## 2016-07-25 DIAGNOSIS — I8311 Varicose veins of right lower extremity with inflammation: Secondary | ICD-10-CM | POA: Insufficient documentation

## 2016-07-25 DIAGNOSIS — I739 Peripheral vascular disease, unspecified: Secondary | ICD-10-CM | POA: Diagnosis not present

## 2016-07-25 DIAGNOSIS — I83893 Varicose veins of bilateral lower extremities with other complications: Secondary | ICD-10-CM

## 2016-07-25 HISTORY — PX: VEIN LIGATION AND STRIPPING: SHX2653

## 2016-07-25 LAB — HCG, SERUM, QUALITATIVE: PREG SERUM: NEGATIVE

## 2016-07-25 LAB — POCT I-STAT 4, (NA,K, GLUC, HGB,HCT)
Glucose, Bld: 94 mg/dL (ref 65–99)
HCT: 49 % — ABNORMAL HIGH (ref 36.0–46.0)
HEMOGLOBIN: 16.7 g/dL — AB (ref 12.0–15.0)
Potassium: 4.1 mmol/L (ref 3.5–5.1)
SODIUM: 140 mmol/L (ref 135–145)

## 2016-07-25 LAB — I-STAT BETA HCG BLOOD, ED (NOT ORDERABLE): I-stat hCG, quantitative: <5 m[IU]/mL (ref <5)

## 2016-07-25 SURGERY — LIGATION AND STRIPPING, VARICOSE VEIN
Anesthesia: General | Laterality: Right

## 2016-07-25 MED ORDER — PROPOFOL 10 MG/ML IV BOLUS
INTRAVENOUS | Status: DC | PRN
  Administered 2016-07-25: 200 mg via INTRAVENOUS
  Administered 2016-07-25: 100 mg via INTRAVENOUS

## 2016-07-25 MED ORDER — PHENYLEPHRINE HCL 10 MG/ML IJ SOLN
INTRAVENOUS | Status: DC | PRN
  Administered 2016-07-25: 25 ug/min via INTRAVENOUS

## 2016-07-25 MED ORDER — HYDROMORPHONE HCL 1 MG/ML IJ SOLN
INTRAMUSCULAR | Status: AC
  Filled 2016-07-25: qty 1

## 2016-07-25 MED ORDER — PROPOFOL 1000 MG/100ML IV EMUL
INTRAVENOUS | Status: AC
  Filled 2016-07-25: qty 100

## 2016-07-25 MED ORDER — PROMETHAZINE HCL 25 MG/ML IJ SOLN
INTRAMUSCULAR | Status: AC
  Filled 2016-07-25: qty 1

## 2016-07-25 MED ORDER — SODIUM CHLORIDE 0.9 % IV SOLN
INTRAVENOUS | Status: DC | PRN
  Administered 2016-07-25: 1000 mg via INTRAVENOUS

## 2016-07-25 MED ORDER — ONDANSETRON HCL 4 MG/2ML IJ SOLN
INTRAMUSCULAR | Status: AC
  Filled 2016-07-25: qty 2

## 2016-07-25 MED ORDER — 0.9 % SODIUM CHLORIDE (POUR BTL) OPTIME
TOPICAL | Status: DC | PRN
  Administered 2016-07-25: 1000 mL

## 2016-07-25 MED ORDER — MIDAZOLAM HCL 2 MG/2ML IJ SOLN
INTRAMUSCULAR | Status: AC
  Filled 2016-07-25: qty 2

## 2016-07-25 MED ORDER — ONDANSETRON HCL 4 MG/2ML IJ SOLN
INTRAMUSCULAR | Status: DC | PRN
  Administered 2016-07-25: 4 mg via INTRAVENOUS

## 2016-07-25 MED ORDER — ARTIFICIAL TEARS OP OINT
TOPICAL_OINTMENT | OPHTHALMIC | Status: DC | PRN
  Administered 2016-07-25: 1 via OPHTHALMIC

## 2016-07-25 MED ORDER — LACTATED RINGERS IV SOLN
INTRAVENOUS | Status: DC | PRN
  Administered 2016-07-25 (×2): via INTRAVENOUS

## 2016-07-25 MED ORDER — PROPOFOL 10 MG/ML IV BOLUS
INTRAVENOUS | Status: AC
  Filled 2016-07-25: qty 20

## 2016-07-25 MED ORDER — TRAMADOL HCL 50 MG PO TABS
ORAL_TABLET | ORAL | Status: AC
  Filled 2016-07-25: qty 1

## 2016-07-25 MED ORDER — TRAMADOL HCL 50 MG PO TABS
50.0000 mg | ORAL_TABLET | Freq: Once | ORAL | Status: AC
  Administered 2016-07-25: 50 mg via ORAL

## 2016-07-25 MED ORDER — LACTATED RINGERS IV SOLN: INTRAVENOUS | Status: DC

## 2016-07-25 MED ORDER — LIDOCAINE 2% (20 MG/ML) 5 ML SYRINGE
INTRAMUSCULAR | Status: AC
  Filled 2016-07-25: qty 5

## 2016-07-25 MED ORDER — MEPERIDINE HCL 25 MG/ML IJ SOLN: 6.2500 mg | INTRAMUSCULAR | Status: DC | PRN

## 2016-07-25 MED ORDER — LIDOCAINE HCL (CARDIAC) 20 MG/ML IV SOLN
INTRAVENOUS | Status: DC | PRN
  Administered 2016-07-25: 50 mg via INTRATRACHEAL

## 2016-07-25 MED ORDER — HYDROMORPHONE HCL 1 MG/ML IJ SOLN: 0.2500 mg | INTRAMUSCULAR | Status: DC | PRN

## 2016-07-25 MED ORDER — PHENYLEPHRINE HCL 10 MG/ML IJ SOLN
INTRAMUSCULAR | Status: DC | PRN
  Administered 2016-07-25: 200 ug via INTRAVENOUS

## 2016-07-25 MED ORDER — SUCCINYLCHOLINE CHLORIDE 20 MG/ML IJ SOLN
INTRAMUSCULAR | Status: DC | PRN
  Administered 2016-07-25: 100 mg via INTRAVENOUS

## 2016-07-25 MED ORDER — TRAMADOL HCL 50 MG PO TABS: 50.0000 mg | ORAL_TABLET | Freq: Four times a day (QID) | ORAL | 0 refills | Status: DC | PRN

## 2016-07-25 MED ORDER — ARTIFICIAL TEARS OP OINT
TOPICAL_OINTMENT | OPHTHALMIC | Status: AC
  Filled 2016-07-25: qty 3.5

## 2016-07-25 MED ORDER — DEXAMETHASONE SODIUM PHOSPHATE 10 MG/ML IJ SOLN
INTRAMUSCULAR | Status: AC
  Filled 2016-07-25: qty 1

## 2016-07-25 MED ORDER — GLYCOPYRROLATE 0.2 MG/ML IJ SOLN
INTRAMUSCULAR | Status: DC | PRN
  Administered 2016-07-25 (×2): 0.1 mg via INTRAVENOUS

## 2016-07-25 MED ORDER — DEXAMETHASONE SODIUM PHOSPHATE 10 MG/ML IJ SOLN
INTRAMUSCULAR | Status: DC | PRN
  Administered 2016-07-25: 10 mg via INTRAVENOUS

## 2016-07-25 MED ORDER — FENTANYL CITRATE (PF) 250 MCG/5ML IJ SOLN
INTRAMUSCULAR | Status: AC
  Filled 2016-07-25: qty 5

## 2016-07-25 MED ORDER — HYDROMORPHONE HCL 1 MG/ML IJ SOLN
0.2500 mg | INTRAMUSCULAR | Status: DC | PRN
  Administered 2016-07-25 (×3): 0.5 mg via INTRAVENOUS

## 2016-07-25 MED ORDER — PROMETHAZINE HCL 25 MG/ML IJ SOLN
6.2500 mg | INTRAMUSCULAR | Status: DC | PRN
  Administered 2016-07-25: 6.25 mg via INTRAVENOUS

## 2016-07-25 MED ORDER — FENTANYL CITRATE (PF) 100 MCG/2ML IJ SOLN
INTRAMUSCULAR | Status: DC | PRN
  Administered 2016-07-25: 75 ug via INTRAVENOUS
  Administered 2016-07-25: 25 ug via INTRAVENOUS

## 2016-07-25 MED ORDER — PROMETHAZINE HCL 25 MG/ML IJ SOLN: 6.2500 mg | INTRAMUSCULAR | Status: DC | PRN

## 2016-07-25 SURGICAL SUPPLY — 58 items
ADH SKN CLS APL DERMABOND .7 (GAUZE/BANDAGES/DRESSINGS) ×1
APL SKNCLS STERI-STRIP NONHPOA (GAUZE/BANDAGES/DRESSINGS) ×1
BAG ISL DRAPE 18X18 STRL (DRAPES) ×1
BAG ISOLATION DRAPE 18X18 (DRAPES) ×1 IMPLANT
BENZOIN TINCTURE PRP APPL 2/3 (GAUZE/BANDAGES/DRESSINGS) ×2 IMPLANT
BLADE SURG 10 STRL SS (BLADE) ×7 IMPLANT
BLADE SURG 11 STRL SS (BLADE) ×3 IMPLANT
BLADE SURG 15 STRL LF DISP TIS (BLADE) IMPLANT
BLADE SURG 15 STRL SS (BLADE)
BNDG COHESIVE 6X5 TAN STRL LF (GAUZE/BANDAGES/DRESSINGS) ×3 IMPLANT
BNDG GAUZE ELAST 4 BULKY (GAUZE/BANDAGES/DRESSINGS) ×3 IMPLANT
CANISTER SUCTION 2500CC (MISCELLANEOUS) ×3 IMPLANT
CANNULA VESSEL 3MM 2 BLNT TIP (CANNULA) IMPLANT
CLIP LIGATING EXTRA MED SLVR (CLIP) ×3 IMPLANT
CLIP LIGATING EXTRA SM BLUE (MISCELLANEOUS) ×3 IMPLANT
CLOSURE WOUND 1/2 X4 (GAUZE/BANDAGES/DRESSINGS) ×1
CLOSURE WOUND 1/4X4 (GAUZE/BANDAGES/DRESSINGS) ×1
COVER PROBE W GEL 5X96 (DRAPES) ×2 IMPLANT
COVER SURGICAL LIGHT HANDLE (MISCELLANEOUS) ×2 IMPLANT
DERMABOND ADVANCED (GAUZE/BANDAGES/DRESSINGS) ×2
DERMABOND ADVANCED .7 DNX12 (GAUZE/BANDAGES/DRESSINGS) ×1 IMPLANT
DRAPE HALF SHEET 40X57 (DRAPES) ×2 IMPLANT
DRAPE INCISE IOBAN 66X45 STRL (DRAPES) ×3 IMPLANT
DRAPE ISOLATION BAG 18X18 (DRAPES) ×2
DRSG COVADERM 4X8 (GAUZE/BANDAGES/DRESSINGS) ×3 IMPLANT
ELECT REM PT RETURN 9FT ADLT (ELECTROSURGICAL) ×3
ELECTRODE REM PT RTRN 9FT ADLT (ELECTROSURGICAL) ×1 IMPLANT
GLOVE BIO SURGEON STRL SZ 6.5 (GLOVE) ×2 IMPLANT
GLOVE BIO SURGEONS STRL SZ 6.5 (GLOVE) ×2
GLOVE SS BIOGEL STRL SZ 7.5 (GLOVE) ×1 IMPLANT
GLOVE SUPERSENSE BIOGEL SZ 7.5 (GLOVE) ×2
GLOVE SURG SS PI 6.5 STRL IVOR (GLOVE) ×2 IMPLANT
GOWN STRL REUS W/ TWL LRG LVL3 (GOWN DISPOSABLE) ×3 IMPLANT
GOWN STRL REUS W/TWL LRG LVL3 (GOWN DISPOSABLE) ×12
KIT BASIN OR (CUSTOM PROCEDURE TRAY) ×3 IMPLANT
KIT ROOM TURNOVER OR (KITS) ×3 IMPLANT
NS IRRIG 1000ML POUR BTL (IV SOLUTION) ×3 IMPLANT
PACK GENERAL/GYN (CUSTOM PROCEDURE TRAY) ×3 IMPLANT
PACK UNIVERSAL I (CUSTOM PROCEDURE TRAY) ×3 IMPLANT
PAD ARMBOARD 7.5X6 YLW CONV (MISCELLANEOUS) ×6 IMPLANT
PADDING CAST COTTON 6X4 STRL (CAST SUPPLIES) ×2 IMPLANT
SPECIMEN JAR SMALL (MISCELLANEOUS) ×3 IMPLANT
STRIP CLOSURE SKIN 1/2X4 (GAUZE/BANDAGES/DRESSINGS) ×1 IMPLANT
STRIP CLOSURE SKIN 1/4X4 (GAUZE/BANDAGES/DRESSINGS) ×1 IMPLANT
SUT SILK 2 0 (SUTURE) ×3
SUT SILK 2 0 SH (SUTURE) IMPLANT
SUT SILK 2-0 18XBRD TIE 12 (SUTURE) ×1 IMPLANT
SUT SILK 3 0 (SUTURE) ×3
SUT SILK 3-0 18XBRD TIE 12 (SUTURE) ×1 IMPLANT
SUT VIC AB 3-0 SH 27 (SUTURE) ×6
SUT VIC AB 3-0 SH 27X BRD (SUTURE) ×1 IMPLANT
SUT VIC AB 3-0 SH 8-18 (SUTURE) IMPLANT
SUT VICRYL 4-0 PS2 18IN ABS (SUTURE) ×6 IMPLANT
SUT VICRYL AB 3 0 TIES (SUTURE) ×3 IMPLANT
TOWEL OR 17X26 10 PK STRL BLUE (TOWEL DISPOSABLE) ×4 IMPLANT
UNDERPAD 30X30 (UNDERPADS AND DIAPERS) ×3 IMPLANT
VEIN STRIPPER DISP (MISCELLANEOUS) ×3 IMPLANT
WATER STERILE IRR 1000ML POUR (IV SOLUTION) ×3 IMPLANT

## 2016-07-25 NOTE — Anesthesia Procedure Notes (Signed)
Procedure Name: LMA Insertion Date/Time: 07/25/2016 8:26 AM Performed by: Collier Bullock Pre-anesthesia Checklist: Patient identified, Emergency Drugs available, Suction available and Patient being monitored Patient Re-evaluated:Patient Re-evaluated prior to inductionOxygen Delivery Method: Circle system utilized Preoxygenation: Pre-oxygenation with 100% oxygen Intubation Type: IV induction Ventilation: Mask ventilation without difficulty LMA: LMA inserted LMA Size: 4.0 Number of attempts: 1 Placement Confirmation: positive ETCO2 and breath sounds checked- equal and bilateral Tube secured with: Tape

## 2016-07-25 NOTE — Telephone Encounter (Signed)
Sched appt 08/05/16 at 1:30. Pt's ph# had no vm. Lm w/ emergency contact to inform pt of appt.

## 2016-07-25 NOTE — Telephone Encounter (Signed)
-----   Message from Mena Goes, RN sent at 07/25/2016 10:43 AM EST ----- Regarding: schedule 2 weeks   ----- Message ----- From: Gabriel Earing, PA-C Sent: 07/25/2016   9:57 AM To: Vvs Charge Pool  S/p RLE vein stripping 07/25/16.  F/u with Dr. Donnetta Hutching in 2 weeks.  Thanks, Aldona Bar

## 2016-07-25 NOTE — Anesthesia Procedure Notes (Signed)
Procedure Name: Intubation Date/Time: 07/25/2016 8:46 AM Performed by: Collier Bullock Pre-anesthesia Checklist: Patient identified, Emergency Drugs available, Suction available and Patient being monitored Patient Re-evaluated:Patient Re-evaluated prior to inductionOxygen Delivery Method: Circle system utilized Preoxygenation: Pre-oxygenation with 100% oxygen Intubation Type: IV induction Ventilation: Nasal airway inserted- appropriate to patient size Laryngoscope Size: Mac and 3 Grade View: Grade I Tube type: Oral Tube size: 7.0 mm Number of attempts: 1 Airway Equipment and Method: Stylet Placement Confirmation: ETT inserted through vocal cords under direct vision,  positive ETCO2 and breath sounds checked- equal and bilateral Secured at: 23 cm Tube secured with: Tape Dental Injury: Teeth and Oropharynx as per pre-operative assessment

## 2016-07-25 NOTE — Anesthesia Postprocedure Evaluation (Addendum)
Anesthesia Post Note  Patient: Elizabeth Mcclure  Procedure(s) Performed: Procedure(s) (LRB): LIGATION AND STRIPPING GREATER SAPHENOUS VEIN; 10-20 STAB PHLEBECTOMY (Right)  Patient location during evaluation: PACU Anesthesia Type: General Level of consciousness: awake and alert Pain management: pain level controlled Vital Signs Assessment: post-procedure vital signs reviewed and stable Respiratory status: spontaneous breathing, nonlabored ventilation, respiratory function stable and patient connected to nasal cannula oxygen Cardiovascular status: blood pressure returned to baseline and stable Postop Assessment: no signs of nausea or vomiting Anesthetic complications: no       Last Vitals:  Vitals:   07/25/16 1044 07/25/16 1045  BP: 111/66 108/72  Pulse: 69 72  Resp: 12 13  Temp: 36.3 C     Last Pain:  Vitals:   07/25/16 1045  TempSrc:   PainSc: [name]

## 2016-07-25 NOTE — H&P (Signed)
Office Visit   06/17/2016 [facility] Specialists -Sharman Crate, MD  Vascular Surgery   Varicose veins of both lower extremities with complications  Dx   Follow-up , Varicose Veins; Referred by Monico Blitz, MD  Reason for Visit   Additional Documentation   Vitals:   BP 134/87 (BP Location: Right Arm, Patient Position: Sitting, Cuff Size: Large)   Pulse 77   Temp 97.1 F (36.2 C) (Oral)   Resp 18   Ht 5' 7.5" (1.715 m)   Wt  307 lb 4.8 oz (139.4 kg)   SpO2 98%   BMI 47.42 kg/m   BSA 2.58 m   Flowsheets:   Infectious Disease Screening,   Healthcare Directives,   Amb Nursing Assessment,   Custom Formula Data,   Vital Signs,   MEWS Score,   Anthropometrics     Encounter Info:   Billing Info,   History,   Allergies,   Detailed Report     All Notes   Progress Notes by Rosetta Posner, MD at 06/17/2016 11:30 AM   Author: Rosetta Posner, MD Author Type: Physician Filed: 06/17/2016 12:01 PM  Note Status: Signed Cosign: Cosign Not Required Encounter Date: 06/17/2016 11:30 AM  Editor: Rosetta Posner, MD (Physician)                                          Vascular and Vein Specialist of [facility]  Patient name: Elizabeth Mcclure MRN: [id number]        DOB: 1966/01/08          Sex: female  REASON FOR VISIT: Follow-up right leg venous hypertension related to incompetent saphenous vein.  HPI: Elizabeth Mcclure is a 51 y.o. female with a complex past history regarding her right leg. She had undergone ablation of her right saphenous vein [place] city. She had unsuccessful closure and partially 5 years ago underwent laser ablation by myself of her right great saphenous vein. She had incomplete closure of her saphenous vein at that setting. She has had progressive difficulty with pain with prolonged standing and increasingly severe pain related to the varicosities in her medial calf. She has been compliant with her graduated compression and has  not had any improvement in this. This does make it difficult for her to continue her activities of daily living. She does have orthopedic issues related to her knees as well and this had left total knee replacement and potentially will undergo a right knee replacement several years in the future.      Past Medical History:  Diagnosis Date  . Arthritis   . Carpal tunnel syndrome   . Constipation   . GERD (gastroesophageal reflux disease)   . 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 Joint pain   . Pneumonia    hx of twice  . Varicose veins   . Varicose veins of right leg with edema    pt states has had vein closure twice in this leg saw Dr Analeese Andreatta 5 years ago pt states has not seen since edema decreases with elevation           Family History  Problem Relation Age of Onset  . Colon cancer Mother 26    passed away this year, 2011-10-25, day after Mother's day.  SOCIAL HISTORY:        Social History  Substance Use Topics  . Smoking status: Former Smoker    Packs/day: 0.50    Years: 3.00    Types: Cigarettes    Quit date: 03/10/2016  . Smokeless tobacco: Never Used     Comment: Quit smoking x 13 years  . Alcohol use Yes      Comment: 2-3 drinks a year        Allergies  Allergen Reactions  . Benadryl [Diphenhydramine] Anaphylaxis and Hives  . Penicillins Hives          Current Outpatient Prescriptions  Medication Sig Dispense Refill  . Cetirizine HCl (ZYRTEC ALLERGY PO) Take by mouth daily.    . diazepam (VALIUM) 5 MG tablet Take 5 mg by mouth every 6 (six) hours as needed for anxiety. Takes 1/2 tablet (of 5 mg tablet) as needed.    . methocarbamol (ROBAXIN) 500 MG tablet Take 1 tablet (500 mg total) by mouth every 6 (six) hours as needed for muscle spasms. 80 tablet 0  . apixaban (ELIQUIS) 2.5 MG TABS tablet Take 1 tablet (2.5 mg total) by mouth 2 (two) times daily. (Patient not taking:  Reported on 06/17/2016) 60 tablet 0  . docusate sodium (COLACE) 100 MG capsule Take 1 capsule (100 mg total) by mouth 2 (two) times daily. (Patient not taking: Reported on 06/17/2016) 60 capsule 3  . HYDROcodone-acetaminophen (NORCO) 5-325 MG per tablet Take 1-2 tablets by mouth every 4 (four) hours as needed for moderate pain. (Patient not taking: Reported on 06/17/2016) 90 tablet 0  . ondansetron (ZOFRAN ODT) 8 MG disintegrating tablet Take 1 tablet (8 mg total) by mouth every 8 (eight) hours as needed for nausea or vomiting. (Patient not taking: Reported on 06/17/2016) 20 tablet 0  . OxyCODONE (OXYCONTIN) 10 mg T12A 12 hr tablet Take 1 tablet (10 mg total) by mouth PRO. 1 tab PO every 12 hours for 3 days, then 1 tab PO daily for 4 days (Patient not taking: Reported on 06/17/2016) 10 tablet 0  . senna (SENOKOT) 8.6 MG TABS tablet Take 2 tablets (17.2 mg total) by mouth at bedtime. (Patient not taking: Reported on 06/17/2016) 60 each 3   No current facility-administered medications for this visit.     REVIEW OF SYSTEMS:  [X]  denotes positive finding, [ ]  denotes negative finding Cardiac  Comments:  Chest pain or chest pressure:    Shortness of breath upon exertion:    Short of breath when lying flat:    Irregular heart rhythm:        Vascular    Pain in calf, thigh, or hip brought on by ambulation:    Pain in feet at night that wakes you up from your sleep:     Blood clot in your veins:    Leg swelling:  x         PHYSICAL EXAM:    Vitals:   06/17/16 1129  BP: 134/87  Pulse: 77  Resp: 18  Temp: 97.1 F (36.2 C)  TempSrc: Oral  SpO2: 98%  Weight: (!) 307 lb 4.8 oz (139.4 kg)  Height: 5' 7.5" (1.715 m)    GENERAL: The patient is a well-nourished female, in no acute distress. The vital signs are documented above. CARDIOVASCULAR: Palpable dorsalis pedis pulses bilaterally Changes of chronic venous hypertension in her right leg with  telangiectasia around her ankle and varicosities which are tender in her medial calf PULMONARY: There is good air  exchange  MUSCULOSKELETAL: There are no major deformities or cyanosis. NEUROLOGIC: No focal weakness or paresthesias are detected. SKIN: There are no ulcers or rashes noted. PSYCHIATRIC: The patient has a normal affect.  DATA:  Formal venous duplex from 03/18/2016 was reviewed. Shows of reflux throughout her great saphenous vein on the right with no significant deep venous reflux  MEDICAL ISSUES: Continued to severe venous hypertension related to superficial reflux in her right leg. Unusual situation that she has 2 failures of ablation. Have recommended ligation and stripping in the operating room as an outpatient and stab phlebectomy of multiple tributary varicosities for symptom relief. She understands procedure recovery and wished to proceed as soon as possible    Rosetta Posner, MD FACS [facility] of [facility] Tel [phone] Pager [phone]     Instructions   Addendum:  The patient has been re-examined and re-evaluated.  The patient's history and physical has been reviewed and is unchanged.    Elizabeth Mcclure is a 51 y.o. female is being admitted with Varicose veins right lower extremity with inflammation I83.11. All the risks, benefits and other treatment options have been discussed with the patient. The patient has consented to proceed with Procedure(s): LIGATION AND STRIPPING GREATER SAPHENOUS VEIN; 10-20 STAB PHLEBECTOMY as a surgical intervention.  Curt Jews 07/25/2016 7:14 AM Vascular and Vein Surgery

## 2016-07-25 NOTE — Op Note (Signed)
    OPERATIVE REPORT  DATE OF SURGERY: 07/25/2016  PATIENT: Elizabeth Mcclure, 51 y.o. female MRN: [id number]  DOB: 29-Mar-1966  PRE-OPERATIVE DIAGNOSIS: Painful right leg varicose veins with complications and severe venous hypertension  POST-OPERATIVE DIAGNOSIS:  Same  PROCEDURE: #1 ligation and stripping of right great saphenous vein from knee to saphenofemoral junction, #2 stab phlebectomy of approximately 20 stab incisions for tributary varicosities in the medial thigh and calf  SURGEON:  Curt Jews, M.D.  PHYSICIAN ASSISTANT: Samantha Rhyne PA-C  ANESTHESIA:  Gen.  EBL: Less than 100 ml  Total I/O In: 1000 [I.V.:1000] Out: -   BLOOD ADMINISTERED: None  DRAINS: None  SPECIMEN: None  COUNTS CORRECT:  YES  PLAN OF CARE: PACU   PATIENT DISPOSITION:  PACU - hemodynamically stable  PROCEDURE DETAILS: The patient was marked while standing in the preoperative holding area. Tributary varicosities in her thigh and calf were marked for later phlebectomy. She was taken to the operating placed supine position. The area of the right groin right leg were prepped and draped in usual sterile fashion. SonoSite ultrasound was used to localize the saphenous vein at the knee. It was a sclerotic below this and patent above. An incision was made over this area and carried down to isolate the saphenous vein and several large tributaries arising from this. These tributaries were ligated. The stripper was passed through the saphenous vein at this location and was followed with SonoSite ultrasound up to the saphenofemoral junction. Separate oblique incision was made over the groin at the saphenofemoral junction and the saphenous vein was greater than 2 cm at this location. It was doubly ligated at the saphenofemoral junction. The tie was placed around the stripper below where it was ligated at the saphenofemoral junction and the saphenous vein was transected. The stripper head was placed on the stripper  and the vein was stripped from the groin to the knee and pressure was held for hemostasis. Next a proximally 20 small stabs were made with an 11 blade over the tributary varicosities. A vein hook was used to remove these tributary varicosities with stab avulsion technique and pressure was held for hemostasis. The incision at the knee and groin were closed with 3-0 Vicryl in the subcutaneous and subcuticular tissue after irrigating them with saline. The stab phlebectomy sites were all closed with Steri-Strips. A Kerlix and Coban pressure dressing was applied and the patient was transferred to the recovery room in stable condition   Rosetta Posner, M.D., [facility] 07/25/2016 10:12 AM

## 2016-07-25 NOTE — Transfer of Care (Signed)
Immediate Anesthesia Transfer of Care Note  Patient: Elizabeth Mcclure  Procedure(s) Performed: Procedure(s): LIGATION AND STRIPPING GREATER SAPHENOUS VEIN; 10-20 STAB PHLEBECTOMY (Right)  Patient Location: PACU  Anesthesia Type:General  Level of Consciousness: awake, alert , oriented and patient cooperative  Airway & Oxygen Therapy: Patient Spontanous Breathing and Patient connected to face mask oxygen  Post-op Assessment: Report given to RN, Post -op Vital signs reviewed and stable, Patient moving all extremities X 4 and Patient able to stick tongue midline  Post vital signs: Reviewed and stable  Last Vitals:  Vitals:   07/25/16 0640  BP: 110/68  Pulse: 82  Resp: 20  Temp: 36.9 C    Last Pain:  Vitals:   07/25/16 0640  TempSrc: Oral         Complications: No apparent anesthesia complications

## 2016-07-26 ENCOUNTER — Encounter (HOSPITAL_COMMUNITY): Payer: Self-pay | Admitting: Vascular Surgery

## 2016-07-29 ENCOUNTER — Encounter: Payer: Self-pay | Admitting: Vascular Surgery

## 2016-07-31 ENCOUNTER — Telehealth: Payer: Self-pay | Admitting: *Deleted

## 2016-07-31 NOTE — Telephone Encounter (Signed)
Returning Mrs. Aime's earlier phone call regarding post op questions.  Mrs. Kellough is s/p RLE vein stripping 07-22-2016.  Mrs. Swayzer states the steri strips have come off her right calf but the tape/steristrips over the sutures remains intact. Advised her to keep tape/stertistrip on over the sutures.  She has a follow up appointment on 08-05-2016 with Dr. Donnetta Hutching and she is aware of that appointment.  She states she has mild/moderate pain over the stab phlebectomy sites in the right calf and along the right inner thigh.  Denies right foot or ankle swelling.  States she is taking 1/2 Tramadol tablet at night before bed for pain as Tramadol  "makes me too sleepy to function during the day."  Advised her to keep her leg elevated when sitting.  Encouraged Mrs. Loudon to call [facility] if she has further questions or concerns.

## 2016-08-05 ENCOUNTER — Ambulatory Visit (INDEPENDENT_AMBULATORY_CARE_PROVIDER_SITE_OTHER): Payer: Self-pay | Admitting: Physician Assistant

## 2016-08-05 VITALS — BP 120/80 | HR 89 | Temp 97.2°F | Resp 20 | Ht 66.5 in | Wt 301.8 lb

## 2016-08-05 DIAGNOSIS — I83893 Varicose veins of bilateral lower extremities with other complications: Secondary | ICD-10-CM

## 2016-08-05 NOTE — Progress Notes (Signed)
   POST OPERATIVE OFFICE NOTE    CC:  F/u for surgery  HPI:  This is a 51 y.o. female who is s/p  PROCEDURE: #1 ligation and stripping of right great saphenous vein from knee to saphenofemoral junction, #2 stab phlebectomy of approximately 20 stab incisions for tributary varicosities in the medial thigh and calf.   She is doing well over all.  Mild swelling and bruising.  No fever or chills.  No changes in her health since surgery.    Allergies  Allergen Reactions  . Benadryl [Diphenhydramine] Anaphylaxis and Hives  . Penicillins Hives    Has patient had a PCN reaction causing immediate rash, facial/tongue/throat swelling, SOB or lightheadedness with hypotension:unsure Has patient had a PCN reaction causing severe rash involving mucus membranes or skin necrosis:unsure Has patient had a PCN reaction that required hospitalization:No Has patient had a PCN reaction occurring within the last 10 years:No If all of the above answers are "NO", then may proceed with Cephalosporin use.     Current Outpatient Prescriptions  Medication Sig Dispense Refill  . traMADol (ULTRAM) 50 MG tablet Take 1 tablet (50 mg total) by mouth every 6 (six) hours as needed. 20 tablet 0  . azithromycin (ZITHROMAX) 250 MG tablet Take 250-500 mg by mouth as directed. 500 mg on day 1, then 250 mg daily for 4 days.    . diazepam (VALIUM) 5 MG tablet Take 2.5-5 mg by mouth every 6 (six) hours as needed for anxiety.     Marland Kitchen Phenylephrine-DM-GG-APAP (TYLENOL COLD/FLU SEVERE PO) Take 2 tablets by mouth 3 (three) times daily as needed (for cold symptoms.).     No current facility-administered medications for this visit.      ROS:  See HPI  Physical Exam:  Vitals:   08/05/16 1326  BP: 120/80  Pulse: 89  Resp: 20  Temp: 97.2 F (36.2 C)    Incision:   left groin soft without hematoma, LE incision healing well.  All stab incisions healing. Extremities:  Surgical site minimal edema and ecchymosis Neuro: sensation  intact distally, active range of motion intact.   Assessment/Plan:  This is a 51 y.o. female who is s/p: #1 ligation and stripping of right great saphenous vein from knee to saphenofemoral junction, #2 stab phlebectomy of approximately 20 stab incisions for tributary varicosities in the medial thigh and calf   No lower extremity compression needed at this time unless it makes her left LE feel better.  Ice PRN.  Slowly increase activity as tolerates.  No restrictions.  F/U PRN.   Theda Sers Pieper Kasik [facility] [phone]  Clinic MD:  Early

## 2016-11-05 ENCOUNTER — Ambulatory Visit (INDEPENDENT_AMBULATORY_CARE_PROVIDER_SITE_OTHER): Payer: BC Managed Care – PPO | Admitting: Obstetrics and Gynecology

## 2016-11-05 ENCOUNTER — Encounter: Payer: Self-pay | Admitting: Obstetrics and Gynecology

## 2016-11-05 VITALS — BP 120/86 | HR 88 | Ht 67.0 in | Wt 301.8 lb

## 2016-11-05 DIAGNOSIS — N841 Polyp of cervix uteri: Secondary | ICD-10-CM

## 2016-11-05 DIAGNOSIS — N939 Abnormal uterine and vaginal bleeding, unspecified: Secondary | ICD-10-CM | POA: Diagnosis not present

## 2016-11-05 NOTE — Addendum Note (Signed)
Addended by: Traci Sermon A on: 11/05/2016 03:24 PM   Modules accepted: Orders

## 2016-11-05 NOTE — Progress Notes (Signed)
[facility] Clinic Visit  @DATE @            Patient name: Elizabeth Mcclure MRN [id number]  Date of birth: [birth year]  CC & HPI:  Elizabeth Mcclure is a 50 y.o. female presenting today for irregular vaginal bleeding that occurs nearly every other week. She states this has happened for the past few years and her periods are becoming heavier. She was informed during her last pelvic exam that she has cervical polyps which is contributing to her bleeding.   ROS:  ROS +irregular bleeding +more frequent bleeding -fever  Pertinent History Reviewed:   Reviewed: Significant for  Medical         Past Medical History:  Diagnosis Date  . Anxiety   . Arthritis   . Carpal tunnel syndrome   . Constipation   . Depression   . GERD (gastroesophageal reflux disease)   . 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 Joint pain   . Pneumonia    hx of twice  . Varicose veins   . Varicose veins of right leg with edema    pt states has had vein closure twice in this leg saw Dr Early 5 years ago pt states has not seen since edema decreases with elevation                               Surgical Hx:    Past Surgical History:  Procedure Laterality Date  . ablation right great sapheous vein      02/09/2008  . CHOLECYSTECTOMY    . COLONOSCOPY  05/24/2012   Procedure: COLONOSCOPY;  Surgeon: Danie Binder, MD;  [facility];  Service: Endoscopy;  Laterality: N/A;  9:30  . DILATION AND CURETTAGE OF UTERUS    . left knee arthroscopy    . stab phlebectomy      02/2008  . TOTAL KNEE ARTHROPLASTY Left 01/18/2015   Procedure: LEFT TOTAL KNEE ARTHROPLASTY;  Surgeon: Rod Can, MD;  Location: [facility];  Service: Orthopedics;  Laterality: Left;  . vein closure     X2, needs vein stripping  . VEIN LIGATION AND STRIPPING Right 07/25/2016   Procedure: LIGATION AND STRIPPING GREATER SAPHENOUS VEIN; 10-20 STAB PHLEBECTOMY;  Surgeon: Rosetta Posner, MD;  Location: [facility]  OR;  Service: Vascular;  Laterality: Right;  . venous duplex      hx of    Medications: Reviewed & Updated - see associated section                       Current Outpatient Prescriptions:  .  azithromycin (ZITHROMAX) 250 MG tablet, Take 250-500 mg by mouth as directed. 500 mg on day 1, then 250 mg daily for 4 days., Disp: , Rfl:  .  diazepam (VALIUM) 5 MG tablet, Take 2.5-5 mg by mouth every 6 (six) hours as needed for anxiety. , Disp: , Rfl:  .  Phenylephrine-DM-GG-APAP (TYLENOL COLD/FLU SEVERE PO), Take 2 tablets by mouth 3 (three) times daily as needed (for cold symptoms.)., Disp: , Rfl:  .  traMADol (ULTRAM) 50 MG tablet, Take 1 tablet (50 mg total) by mouth every 6 (six) hours as needed., Disp: 20 tablet, Rfl: 0   Social History: Reviewed -  reports that she quit smoking about 7 months ago. Her smoking use included Cigarettes. She has a  1.50 pack-year smoking history. She has never used smokeless tobacco.  Objective Findings:  Vitals: Blood pressure 120/86, pulse 88, height 5\' 7"  (1.702 m), weight (!) 301 lb 12.8 oz (136.9 kg), last menstrual period 10/23/2016.  Physical Examination: General appearance - alert, well appearing, and in no distress Mental status - alert, oriented to person, place, and time Pelvic -  VULVA: normal appearing vulva with no masses, tenderness or lesions,  VAGINA: normal appearing vagina with normal color and discharge, no lesions,  CERVIX: 1.5 cm long endocervical vs endometrial polyp UTERUS: uterus is normal size, shape, consistency and nontender, no identifiable masses ADNEXA: normal adnexa in size, nontender and no identifiable masses   Assessment & Plan:   A:  1. Endocervical vs endometrial polyp 2. Abnormal uterine bleeding   P:  1. Follow up pathology 2. Transvaginal US 3. May need endometrial biopsy    By signing my name below, I, Sonum Patel, attest that this documentation has been prepared under the direction and in the presence of Jonnie Kind, MD. Electronically Signed: Sonum Patel, Education administrator. 11/05/16. 2:10 PM.  I personally performed the services described in this documentation, which was SCRIBED in my presence. The recorded information has been reviewed and considered accurate. It has been edited as necessary during review. Jonnie Kind, MD

## 2016-11-10 ENCOUNTER — Telehealth: Payer: Self-pay | Admitting: *Deleted

## 2016-11-10 ENCOUNTER — Other Ambulatory Visit: Payer: Self-pay | Admitting: Obstetrics and Gynecology

## 2016-11-10 DIAGNOSIS — N939 Abnormal uterine and vaginal bleeding, unspecified: Secondary | ICD-10-CM

## 2016-11-10 NOTE — Telephone Encounter (Signed)
Informed patient of benign endometrial polyp. Pt verbalized gratitude.

## 2016-11-11 ENCOUNTER — Ambulatory Visit (INDEPENDENT_AMBULATORY_CARE_PROVIDER_SITE_OTHER): Payer: BC Managed Care – PPO

## 2016-11-11 DIAGNOSIS — N939 Abnormal uterine and vaginal bleeding, unspecified: Secondary | ICD-10-CM

## 2016-11-11 NOTE — Progress Notes (Signed)
PELVIC US TA/TV: heterogeneous anteverted uterus w/a anterior right intramural fibroid 1.2 x .7 x 1.3 cm,EEC 7.8 mm,normal ovaries bilat,mult simple nabothian cysts,no free fluid,no pain during ultrasound

## 2016-11-24 ENCOUNTER — Ambulatory Visit: Payer: BC Managed Care – PPO | Admitting: Obstetrics and Gynecology

## 2016-12-03 ENCOUNTER — Encounter: Payer: Self-pay | Admitting: Obstetrics and Gynecology

## 2016-12-03 ENCOUNTER — Ambulatory Visit (INDEPENDENT_AMBULATORY_CARE_PROVIDER_SITE_OTHER): Payer: BC Managed Care – PPO | Admitting: Obstetrics and Gynecology

## 2016-12-03 VITALS — BP 124/86 | HR 84 | Wt 301.0 lb

## 2016-12-03 DIAGNOSIS — N939 Abnormal uterine and vaginal bleeding, unspecified: Secondary | ICD-10-CM

## 2016-12-03 NOTE — Progress Notes (Signed)
[place] Clinic Visit  12/03/2016           Patient name: Elizabeth Mcclure MRN [id number]  Date of birth: 1965/11/04  CC & HPI:  Elizabeth Mcclure is a 51 y.o. female presenting today for follow up concerning abnormal uterine bleeding and results of endometrial biopsy. Pt states bleeding has resolved. She has no other acute complaints or associated symptoms at this time.   ROS:  ROS Otherwise negative for acute change except as noted in the HPI.  Pertinent History Reviewed:   Reviewed:  Medical         Past Medical History:  Diagnosis Date  . Anxiety   . Arthritis   . Carpal tunnel syndrome   . Constipation   . Depression   . GERD (gastroesophageal reflux disease)   . 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 Joint pain   . Pneumonia    hx of twice  . Varicose veins   . Varicose veins of right leg with edema    pt states has had vein closure twice in this leg saw Dr Early 5 years ago pt states has not seen since edema decreases with elevation                               Surgical Hx:    Past Surgical History:  Procedure Laterality Date  . ablation right great sapheous vein      02/09/2008  . CHOLECYSTECTOMY    . COLONOSCOPY  05/24/2012   Procedure: COLONOSCOPY;  Surgeon: Danie Binder, MD;  [facility];  Service: Endoscopy;  Laterality: N/A;  9:30  . DILATION AND CURETTAGE OF UTERUS    . left knee arthroscopy    . stab phlebectomy      02/2008  . TOTAL KNEE ARTHROPLASTY Left 01/18/2015   Procedure: LEFT TOTAL KNEE ARTHROPLASTY;  Surgeon: Rod Can, MD;  Location: [facility];  Service: Orthopedics;  Laterality: Left;  . vein closure     X2, needs vein stripping  . VEIN LIGATION AND STRIPPING Right 07/25/2016   Procedure: LIGATION AND STRIPPING GREATER SAPHENOUS VEIN; 10-20 STAB PHLEBECTOMY;  Surgeon: Rosetta Posner, MD;  Location: [facility] OR;  Service: Vascular;  Laterality: Right;  . venous duplex      hx of     Medications: Reviewed & Updated - see associated section                       Current Outpatient Prescriptions:  .  cetirizine (ZYRTEC) 10 MG tablet, Take 10 mg by mouth daily., Disp: , Rfl:  .  diazepam (VALIUM) 5 MG tablet, Take 2.5-5 mg by mouth every 6 (six) hours as needed for anxiety. , Disp: , Rfl:    Social History: Reviewed -  reports that she quit smoking about 8 months ago. Her smoking use included Cigarettes. She has a 1.50 pack-year smoking history. She has never used smokeless tobacco.  Objective Findings:  Vitals: Blood pressure 124/86, pulse 84, weight (!) 301 lb (136.5 kg), last menstrual period 11/12/2016.  Physical Examination: General appearance - alert, well appearing, and in no distress Mental status - alert, oriented to person, place, and time  Discussion: 1. Discussed with pt results of endometrial biopsy which showed benign endometrial polyp   At end of discussion, pt had opportunity to  ask questions and has no further questions at this time.  U/s showed a symmetric endometrium thin Specific discussion of biopsy results as noted above. Greater than 50% was spent in counseling and coordination of care with the patient.   Total time greater than: 10 minutes.    Assessment & Plan:   A:  1. Benign endometrial polyp   P:  1. Follow up in 1 year for PAP/PE    By signing my name below, I, Elizabeth Mcclure, attest that this documentation has been prepared under the direction and in the presence of Jonnie Kind, MD . Electronically Signed: Evelene Mcclure, Scribe. 12/03/2016. 11:51 AM. I personally performed the services described in this documentation, which was SCRIBED in my presence. The recorded information has been reviewed and considered accurate. It has been edited as necessary during review. Jonnie Kind, MD

## 2016-12-05 NOTE — Addendum Note (Signed)
Addendum  created 12/05/16 0908 by Effie Berkshire, MD   Sign clinical note

## 2017-06-01 DIAGNOSIS — Z713 Dietary counseling and surveillance: Secondary | ICD-10-CM | POA: Diagnosis not present

## 2017-06-01 DIAGNOSIS — R079 Chest pain, unspecified: Secondary | ICD-10-CM | POA: Diagnosis not present

## 2017-06-01 DIAGNOSIS — Z299 Encounter for prophylactic measures, unspecified: Secondary | ICD-10-CM | POA: Diagnosis not present

## 2017-06-01 DIAGNOSIS — Z6841 Body Mass Index (BMI) 40.0 and over, adult: Secondary | ICD-10-CM | POA: Diagnosis not present

## 2017-06-01 DIAGNOSIS — Z2821 Immunization not carried out because of patient refusal: Secondary | ICD-10-CM | POA: Diagnosis not present

## 2017-06-01 DIAGNOSIS — F419 Anxiety disorder, unspecified: Secondary | ICD-10-CM | POA: Diagnosis not present

## 2017-06-12 DIAGNOSIS — Z299 Encounter for prophylactic measures, unspecified: Secondary | ICD-10-CM | POA: Diagnosis not present

## 2017-06-12 DIAGNOSIS — F17210 Nicotine dependence, cigarettes, uncomplicated: Secondary | ICD-10-CM | POA: Diagnosis not present

## 2017-06-12 DIAGNOSIS — Z6841 Body Mass Index (BMI) 40.0 and over, adult: Secondary | ICD-10-CM | POA: Diagnosis not present

## 2017-06-12 DIAGNOSIS — J45909 Unspecified asthma, uncomplicated: Secondary | ICD-10-CM | POA: Diagnosis not present

## 2017-06-12 DIAGNOSIS — J320 Chronic maxillary sinusitis: Secondary | ICD-10-CM | POA: Diagnosis not present

## 2017-06-12 DIAGNOSIS — I739 Peripheral vascular disease, unspecified: Secondary | ICD-10-CM | POA: Diagnosis not present

## 2017-06-12 DIAGNOSIS — F419 Anxiety disorder, unspecified: Secondary | ICD-10-CM | POA: Diagnosis not present

## 2017-06-12 DIAGNOSIS — F329 Major depressive disorder, single episode, unspecified: Secondary | ICD-10-CM | POA: Diagnosis not present

## 2017-06-12 DIAGNOSIS — Z2821 Immunization not carried out because of patient refusal: Secondary | ICD-10-CM | POA: Diagnosis not present

## 2017-07-07 HISTORY — PX: JOINT REPLACEMENT: SHX530

## 2017-10-26 ENCOUNTER — Ambulatory Visit: Payer: BC Managed Care – PPO | Admitting: Obstetrics and Gynecology

## 2017-12-04 ENCOUNTER — Other Ambulatory Visit: Payer: BC Managed Care – PPO | Admitting: Obstetrics and Gynecology

## 2017-12-07 ENCOUNTER — Ambulatory Visit: Payer: Medicare Other | Admitting: Obstetrics and Gynecology

## 2017-12-07 ENCOUNTER — Encounter: Payer: Self-pay | Admitting: Obstetrics and Gynecology

## 2017-12-07 ENCOUNTER — Other Ambulatory Visit (HOSPITAL_COMMUNITY)
Admission: RE | Admit: 2017-12-07 | Discharge: 2017-12-07 | Disposition: A | Discharge status: Home / Self Care | Payer: Medicare Other | Source: Ambulatory Visit | Attending: Obstetrics and Gynecology | Admitting: Obstetrics and Gynecology

## 2017-12-07 VITALS — BP 124/70 | HR 78 | Ht 68.0 in | Wt 304.6 lb

## 2017-12-07 DIAGNOSIS — Z01419 Encounter for gynecological examination (general) (routine) without abnormal findings: Secondary | ICD-10-CM | POA: Insufficient documentation

## 2017-12-07 DIAGNOSIS — N841 Polyp of cervix uteri: Secondary | ICD-10-CM | POA: Diagnosis not present

## 2017-12-07 DIAGNOSIS — N921 Excessive and frequent menstruation with irregular cycle: Secondary | ICD-10-CM

## 2017-12-07 MED ORDER — MEDROXYPROGESTERONE ACETATE 10 MG PO TABS: 10.0000 mg | ORAL_TABLET | Freq: Every day | ORAL | 0 refills | Status: DC

## 2017-12-07 NOTE — Progress Notes (Signed)
Patient ID: Elizabeth Mcclure, female   DOB: Aug 21, 1965, 52 y.o.   MRN: [id number]   Assessment:  Annual Gyn Exam Plan:  1. pap smear done, next pap due 3 years 2. return annually or prn 3.   Annual mammogram advised after age 12 4.   Provera for 14 days 5.   F/u for ultrasound in 3 weeks Subjective:  Elizabeth Mcclure is a 51 y.o. female [id number] who presents for annual exam. Patient's last menstrual period was 10/12/2017. cyacles have been very irregular over last 19 months, skipping up to 6 months at at time. The patient has complaints today of inconsistent periods and notes her cramps are mild. The following portions of the patient's history were reviewed and updated as appropriate: allergies, current medications, past family history, past medical history, past social history, past surgical history and problem list. Past Medical History:  Diagnosis Date  . Anxiety   . Arthritis   . Carpal tunnel syndrome   . Constipation   . Depression   . GERD (gastroesophageal reflux disease)   . 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 Joint pain   . Pneumonia    hx of twice  . Varicose veins   . Varicose veins of right leg with edema    pt states has had vein closure twice in this leg saw Dr Early 5 years ago pt states has not seen since edema decreases with elevation     Past Surgical History:  Procedure Laterality Date  . ablation right great sapheous vein      02/09/2008  . CHOLECYSTECTOMY    . COLONOSCOPY  05/24/2012   Procedure: COLONOSCOPY;  Surgeon: Danie Binder, MD;  [facility];  Service: Endoscopy;  Laterality: N/A;  9:30  . DILATION AND CURETTAGE OF UTERUS    . left knee arthroscopy    . stab phlebectomy      02/2008  . TOTAL KNEE ARTHROPLASTY Left 01/18/2015   Procedure: LEFT TOTAL KNEE ARTHROPLASTY;  Surgeon: Rod Can, MD;  Location: [facility];  Service: Orthopedics;  Laterality: Left;  . vein closure     X2, needs  vein stripping  . VEIN LIGATION AND STRIPPING Right 07/25/2016   Procedure: LIGATION AND STRIPPING GREATER SAPHENOUS VEIN; 10-20 STAB PHLEBECTOMY;  Surgeon: Rosetta Posner, MD;  Location: [facility] OR;  Service: Vascular;  Laterality: Right;  . venous duplex      hx of      Current Outpatient Medications:  .  cetirizine (ZYRTEC) 10 MG tablet, Take 10 mg by mouth daily., Disp: , Rfl:  .  diazepam (VALIUM) 5 MG tablet, Take 2.5-5 mg by mouth every 6 (six) hours as needed for anxiety. , Disp: , Rfl:   Review of Systems Constitutional: negative Gastrointestinal: negative Genitourinary: negative  Objective:  BP 124/70 (BP Location: Right Arm, Patient Position: Sitting, Cuff Size: Large)   Pulse 78   Ht 5\' 8"  (1.727 m)   Wt (!) 304 lb 9.6 oz (138.2 kg)   LMP 10/12/2017   BMI 46.31 kg/m    BMI: Body mass index is 46.31 kg/m.  General Appearance: Alert, appropriate appearance for age. No acute distress HEENT: Grossly normal Neck / Thyroid:  Cardiovascular: RRR; normal S1, S2, no murmur Lungs: CTA bilaterally Back: No CVAT Breast Exam: Normal breast tissue bilaterally and No masses or nodes.No dimpling, nipple retraction or discharge. Gastrointestinal: Soft, non-tender, no masses or organomegaly Pelvic  exam:  VULVA: normal appearing vulva with no masses, tenderness or lesions VAGINA: normal appearing vagina with normal color and discharge, no lesions CERVIX: normal appearing cervix without discharge or lesions, UTERUS: uterus is normal size, shape, consistency and nontender, retroverted ADNEXA: Negative PAP: Pap smear done today Exam limited due to body mass  Rectovaginal: not indicated Lymphatic Exam: Non-palpable nodes in neck, clavicular, axillary, or inguinal regions Skin: no rash or abnormalities Neurologic: Normal gait and speech, no tremor  Psychiatric: Alert and oriented, appropriate affect.  Urinalysis:Not done  Discussion: Discussed with pt risks and benefits of endometrial  ablation. Advised pt that intrauterine pregnancy is nearly 100% preventable with ablation. Discussed reduced risk of ectopic pregnancy and reduced risk of ovarian cancer from 1 in 100 to 1 in 300 with bilateral salpingectomy in a lengthy conversation with risks benefits rationale and alternatives including IUD reviewed. Brochures given.   At end of discussion, pt had opportunity to ask questions and has no further questions at this time.   Specific discussion of endometrial ablation and bilateral salpingectomy as noted above. Greater than 50% was spent in counseling and coordination of care with the patient.   Total time greater than:  25 minutes.    Mallory Shirk. MD Pgr [phone] 12:16 PM   By signing my name below, I, Samul Dada, attest that this documentation has been prepared under the direction and in the presence of Jonnie Kind, MD. Electronically Signed: [place]. 12/07/17. 12:16 PM.  I personally performed the services described in this documentation, which was SCRIBED in my presence. The recorded information has been reviewed and considered accurate. It has been edited as necessary during review. Jonnie Kind, MD

## 2017-12-09 LAB — CYTOLOGY - PAP
Diagnosis: UNDETERMINED — AB
HPV (WINDOPATH): NOT DETECTED

## 2017-12-14 ENCOUNTER — Telehealth: Payer: Self-pay | Admitting: *Deleted

## 2017-12-14 NOTE — Telephone Encounter (Signed)
Patient informed pap showed ASCUS requiring repeat pap in 1 year.  Verbalized understanding.

## 2017-12-18 ENCOUNTER — Telehealth: Payer: Self-pay | Admitting: *Deleted

## 2017-12-18 NOTE — Telephone Encounter (Signed)
Patient states she went to her family doctor and was advised to let us know Provera has not worked.  Patient states she has 3 days left.  Advised to take last 3 and keep u/s as scheduled.  Verbalized understanding.

## 2017-12-25 ENCOUNTER — Other Ambulatory Visit: Payer: Self-pay | Admitting: Obstetrics and Gynecology

## 2017-12-25 DIAGNOSIS — N921 Excessive and frequent menstruation with irregular cycle: Secondary | ICD-10-CM

## 2017-12-28 ENCOUNTER — Other Ambulatory Visit: Payer: Medicare Other

## 2018-01-06 ENCOUNTER — Ambulatory Visit (INDEPENDENT_AMBULATORY_CARE_PROVIDER_SITE_OTHER): Payer: Medicare Other

## 2018-01-06 ENCOUNTER — Encounter (INDEPENDENT_AMBULATORY_CARE_PROVIDER_SITE_OTHER): Payer: Self-pay

## 2018-01-06 DIAGNOSIS — N921 Excessive and frequent menstruation with irregular cycle: Secondary | ICD-10-CM | POA: Diagnosis not present

## 2018-01-06 NOTE — Progress Notes (Signed)
PELVIC US TA/TV:anteverted uterus w/a small anterior right intramural fibroid 1.2 x .7 x 1.1 cm,mult simple nabothian cysts,normal right ovary,simple left ovarian cyst 2.4 x 2.3 x 1.7 cm ,ovaries appear mobile,no pain during ultrasound,no free fluid,EEC 8.2 mm

## 2018-11-10 ENCOUNTER — Other Ambulatory Visit: Payer: Self-pay | Admitting: Neurosurgery

## 2018-11-10 NOTE — Pre-Procedure Instructions (Signed)
Elizabeth Mcclure  11/10/2018      [facility] - [address], [place] [address] Phone: [phone] Fax: [phone]    Your procedure is scheduled on May 8  Report to [place] at 5:30 A.M.  Call this number if you have problems the morning of surgery:  [phone]   Remember:  Do not eat or drink after midnight.      Take these medicines the morning of surgery with A SIP OF WATER :              Alprazolam (xanax) if needed             Cetirizine (zyrtec)             Oxycodone if needed             7 days prior to surgery STOP taking any Aspirin (unless otherwise instructed by your surgeon), Aleve, Naproxen, Ibuprofen, Motrin, Advil, Goody's, BC's, all herbal medications, fish oil, and all vitamins.    Do not wear jewelry, make-up or nail polish.  Do not wear lotions, powders, or perfumes, or deodorant.  Do not shave 48 hours prior to surgery.  Men may shave face and neck.  Do not bring valuables to the hospital.  [facility] is not responsible for any belongings or valuables.  Contacts, dentures or bridgework may not be worn into surgery.  Leave your suitcase in the car.  After surgery it may be brought to your room.  For patients admitted to the hospital, discharge time will be determined by your treatment team.  Patients discharged the day of surgery will not be allowed to drive home.    Special instructions:   [place]- Preparing For Surgery  Before surgery, you can play an important role. Because skin is not sterile, your skin needs to be as free of germs as possible. You can reduce the number of germs on your skin by washing with CHG (chlorahexidine gluconate) Soap before surgery.  CHG is an antiseptic cleaner which kills germs and bonds with the skin to continue killing germs even after washing.    Oral Hygiene is also important to reduce your risk of infection.  Remember - BRUSH YOUR  TEETH THE MORNING OF SURGERY WITH YOUR REGULAR TOOTHPASTE  Please do not use if you have an allergy to CHG or antibacterial soaps. If your skin becomes reddened/irritated stop using the CHG.  Do not shave (including legs and underarms) for at least 48 hours prior to first CHG shower. It is OK to shave your face.  Please follow these instructions carefully.   1. Shower the NIGHT BEFORE SURGERY and the MORNING OF SURGERY with CHG.   2. If you chose to wash your hair, wash your hair first as usual with your normal shampoo.  3. After you shampoo, rinse your hair and body thoroughly to remove the shampoo.  4. Use CHG as you would any other liquid soap. You can apply CHG directly to the skin and wash gently with a scrungie or a clean washcloth.   5. Apply the CHG Soap to your body ONLY FROM THE NECK DOWN.  Do not use on open wounds or open sores. Avoid contact with your eyes, ears, mouth and genitals (private parts). Wash Face and genitals (private parts)  with your normal soap.  6. Wash thoroughly, paying special attention to the area where  your surgery will be performed.  7. Thoroughly rinse your body with warm water from the neck down.  8. DO NOT shower/wash with your normal soap after using and rinsing off the CHG Soap.  9. Pat yourself dry with a CLEAN TOWEL.  10. Wear CLEAN PAJAMAS to bed the night before surgery, wear comfortable clothes the morning of surgery  11. Place CLEAN SHEETS on your bed the night of your first shower and DO NOT SLEEP WITH PETS.    Day of Surgery:  Do not apply any deodorants/lotions.  Please wear clean clothes to the hospital/surgery center.   Remember to brush your teeth WITH YOUR REGULAR TOOTHPASTE.    Please read over the following fact sheets that you were given. Coughing and Deep Breathing and Surgical Site Infection Prevention

## 2018-11-11 ENCOUNTER — Encounter (HOSPITAL_COMMUNITY): Payer: Self-pay

## 2018-11-11 ENCOUNTER — Other Ambulatory Visit: Payer: Self-pay

## 2018-11-11 ENCOUNTER — Encounter (HOSPITAL_COMMUNITY)
Admission: RE | Admit: 2018-11-11 | Discharge: 2018-11-11 | Disposition: A | Discharge status: Home / Self Care | Payer: Medicare Other | Source: Ambulatory Visit | Attending: Neurosurgery | Admitting: Neurosurgery

## 2018-11-11 DIAGNOSIS — M4802 Spinal stenosis, cervical region: Secondary | ICD-10-CM | POA: Diagnosis not present

## 2018-11-11 DIAGNOSIS — M50122 Cervical disc disorder at C5-C6 level with radiculopathy: Secondary | ICD-10-CM | POA: Diagnosis not present

## 2018-11-11 DIAGNOSIS — M1990 Unspecified osteoarthritis, unspecified site: Secondary | ICD-10-CM | POA: Diagnosis not present

## 2018-11-11 DIAGNOSIS — K219 Gastro-esophageal reflux disease without esophagitis: Secondary | ICD-10-CM | POA: Diagnosis not present

## 2018-11-11 DIAGNOSIS — Z79899 Other long term (current) drug therapy: Secondary | ICD-10-CM | POA: Diagnosis not present

## 2018-11-11 DIAGNOSIS — M50123 Cervical disc disorder at C6-C7 level with radiculopathy: Secondary | ICD-10-CM | POA: Diagnosis present

## 2018-11-11 DIAGNOSIS — F419 Anxiety disorder, unspecified: Secondary | ICD-10-CM | POA: Diagnosis not present

## 2018-11-11 DIAGNOSIS — F17210 Nicotine dependence, cigarettes, uncomplicated: Secondary | ICD-10-CM | POA: Diagnosis not present

## 2018-11-11 DIAGNOSIS — G709 Myoneural disorder, unspecified: Secondary | ICD-10-CM | POA: Diagnosis not present

## 2018-11-11 DIAGNOSIS — F329 Major depressive disorder, single episode, unspecified: Secondary | ICD-10-CM | POA: Diagnosis not present

## 2018-11-11 DIAGNOSIS — Z01812 Encounter for preprocedural laboratory examination: Secondary | ICD-10-CM | POA: Insufficient documentation

## 2018-11-11 DIAGNOSIS — Z6841 Body Mass Index (BMI) 40.0 and over, adult: Secondary | ICD-10-CM | POA: Diagnosis not present

## 2018-11-11 LAB — BASIC METABOLIC PANEL
Anion gap: 10 (ref 5–15)
BUN: 12 mg/dL (ref 6–20)
CO2: 24 mmol/L (ref 22–32)
Calcium: 9.1 mg/dL (ref 8.9–10.3)
Chloride: 103 mmol/L (ref 98–111)
Creatinine, Ser: 0.84 mg/dL (ref 0.44–1.00)
GFR calc Af Amer: >60 mL/min (ref >60)
GFR calc non Af Amer: >60 mL/min (ref >60)
Glucose, Bld: 138 mg/dL — ABNORMAL HIGH (ref 70–99)
Potassium: 4 mmol/L (ref 3.5–5.1)
Sodium: 137 mmol/L (ref 135–145)

## 2018-11-11 LAB — TYPE AND SCREEN
ABO/RH(D): O POS
Antibody Screen: NEGATIVE

## 2018-11-11 LAB — CBC
HCT: 47.1 % — ABNORMAL HIGH (ref 36.0–46.0)
Hemoglobin: 15 g/dL (ref 12.0–15.0)
MCH: 27.2 pg (ref 26.0–34.0)
MCHC: 31.8 g/dL (ref 30.0–36.0)
MCV: 85.5 fL (ref 80.0–100.0)
Platelets: 176 10*3/uL (ref 150–400)
RBC: 5.51 MIL/uL — ABNORMAL HIGH (ref 3.87–5.11)
RDW: 13.9 % (ref 11.5–15.5)
WBC: 7.1 10*3/uL (ref 4.0–10.5)
nRBC: 0 % (ref 0.0–0.2)

## 2018-11-11 LAB — SURGICAL PCR SCREEN
MRSA, PCR: NEGATIVE
Staphylococcus aureus: POSITIVE — AB

## 2018-11-11 LAB — ABO/RH: ABO/RH(D): O POS

## 2018-11-11 NOTE — Anesthesia Preprocedure Evaluation (Signed)
Anesthesia Evaluation  Patient identified by MRN, date of birth, ID band Patient awake    Reviewed: Allergy & Precautions, NPO status , Patient's Chart, lab work & pertinent test results  Airway Mallampati: I  TM Distance: >3 FB Neck ROM: Full    Dental  (+) Teeth Intact, Dental Advisory Given   Pulmonary Current Smoker, former smoker,    breath sounds clear to auscultation       Cardiovascular + Peripheral Vascular Disease   Rhythm:Regular Rate:Normal     Neuro/Psych PSYCHIATRIC DISORDERS Anxiety Depression  Neuromuscular disease    GI/Hepatic Neg liver ROS, GERD  ,  Endo/Other  Morbid obesity  Renal/GU negative Renal ROS  negative genitourinary   Musculoskeletal  (+) Arthritis , Osteoarthritis,    Abdominal   Peds negative pediatric ROS (+)  Hematology negative hematology ROS (+)   Anesthesia Other Findings   Reproductive/Obstetrics negative OB ROS                             Lab Results  Component Value Date   WBC 7.1 11/11/2018   HGB 15.0 11/11/2018   HCT 47.1 (H) 11/11/2018   MCV 85.5 11/11/2018   PLT 176 11/11/2018   Lab Results  Component Value Date   CREATININE 0.84 11/11/2018   BUN 12 11/11/2018   NA 137 11/11/2018   K 4.0 11/11/2018   CL 103 11/11/2018   CO2 24 11/11/2018   Lab Results  Component Value Date   INR 0.96 01/12/2015     Anesthesia Physical  Anesthesia Plan  ASA: III  Anesthesia Plan: General   Post-op Pain Management:    Induction: Intravenous  PONV Risk Score and Plan: 3 and Ondansetron, Dexamethasone, Treatment may vary due to age or medical condition and Midazolam  Airway Management Planned: Oral ETT and Video Laryngoscope Planned  Additional Equipment:   Intra-op Plan:   Post-operative Plan: Extubation in OR  Informed Consent: I have reviewed the patients History and Physical, chart, labs and discussed the procedure including  the risks, benefits and alternatives for the proposed anesthesia with the patient or authorized representative who has indicated his/her understanding and acceptance.       Plan Discussed with: CRNA and Anesthesiologist  Anesthesia Plan Comments: (Discussed her hist)        Anesthesia Quick Evaluation

## 2018-11-11 NOTE — Progress Notes (Signed)
PCP - Dr. Brigitte Pulse  Cardiologist - Denies  Chest x-ray - Denies  EKG - Denies  Stress Test - Denies  ECHO - Denies  Cardiac Cath - Denies  AICD-na PM-na LOOP-na  Sleep Study - Denies CPAP - Denies  LABS- 11/11/2018: CBC, BMP, PCR UPreg- 11/12/2018  ASA- Denies  ERAS-No  Anesthesia- No  Pt denies having chest pain, sob, or fever at this time. All instructions explained to the pt, with a verbal understanding of the material. Pt agrees to go over the instructions while at home for a better understanding. The opportunity to ask questions was provided.   Coronavirus Screening  Have you experienced the following symptoms:  Cough yes/no: No Fever (>100.61F)  yes/no: No Runny nose yes/no: No Sore throat yes/no: No Difficulty breathing/shortness of breath  yes/no: No  Have you or a family member traveled in the last 14 days and where? yes/no: No   If the patient indicates "YES" to the above questions, their PAT will be rescheduled to limit the exposure to others and, the surgeon will be notified. THE PATIENT WILL NEED TO BE ASYMPTOMATIC FOR 14 DAYS.   If the patient is not experiencing any of these symptoms, the PAT nurse will instruct them to NOT bring anyone with them to their appointment since they may have these symptoms or traveled as well.   Please remind your patients and families that hospital visitation restrictions are in effect and the importance of the restrictions.

## 2018-11-12 ENCOUNTER — Encounter (HOSPITAL_COMMUNITY): Payer: Self-pay

## 2018-11-12 ENCOUNTER — Ambulatory Visit (HOSPITAL_COMMUNITY): Payer: Medicare Other

## 2018-11-12 ENCOUNTER — Encounter (HOSPITAL_COMMUNITY)
Admission: RE | Disposition: A | Discharge status: Home / Self Care | Payer: Self-pay | Source: Home / Self Care | Attending: Neurosurgery

## 2018-11-12 ENCOUNTER — Ambulatory Visit (HOSPITAL_COMMUNITY): Payer: Medicare Other | Admitting: Physician Assistant

## 2018-11-12 ENCOUNTER — Ambulatory Visit (HOSPITAL_COMMUNITY): Payer: Medicare Other | Admitting: Anesthesiology

## 2018-11-12 ENCOUNTER — Other Ambulatory Visit: Payer: Self-pay

## 2018-11-12 ENCOUNTER — Observation Stay (HOSPITAL_COMMUNITY)
Admission: RE | Admit: 2018-11-12 | Discharge: 2018-11-12 | Disposition: A | Discharge status: Home / Self Care | Payer: Medicare Other | Attending: Neurosurgery | Admitting: Neurosurgery

## 2018-11-12 DIAGNOSIS — M50123 Cervical disc disorder at C6-C7 level with radiculopathy: Secondary | ICD-10-CM | POA: Insufficient documentation

## 2018-11-12 DIAGNOSIS — M1990 Unspecified osteoarthritis, unspecified site: Secondary | ICD-10-CM | POA: Insufficient documentation

## 2018-11-12 DIAGNOSIS — Z419 Encounter for procedure for purposes other than remedying health state, unspecified: Secondary | ICD-10-CM

## 2018-11-12 DIAGNOSIS — M4802 Spinal stenosis, cervical region: Secondary | ICD-10-CM | POA: Insufficient documentation

## 2018-11-12 DIAGNOSIS — M50122 Cervical disc disorder at C5-C6 level with radiculopathy: Secondary | ICD-10-CM | POA: Diagnosis not present

## 2018-11-12 DIAGNOSIS — F17210 Nicotine dependence, cigarettes, uncomplicated: Secondary | ICD-10-CM | POA: Insufficient documentation

## 2018-11-12 DIAGNOSIS — F329 Major depressive disorder, single episode, unspecified: Secondary | ICD-10-CM | POA: Insufficient documentation

## 2018-11-12 DIAGNOSIS — F419 Anxiety disorder, unspecified: Secondary | ICD-10-CM | POA: Diagnosis not present

## 2018-11-12 DIAGNOSIS — G709 Myoneural disorder, unspecified: Secondary | ICD-10-CM | POA: Insufficient documentation

## 2018-11-12 DIAGNOSIS — M5020 Other cervical disc displacement, unspecified cervical region: Secondary | ICD-10-CM

## 2018-11-12 DIAGNOSIS — Z6841 Body Mass Index (BMI) 40.0 and over, adult: Secondary | ICD-10-CM | POA: Insufficient documentation

## 2018-11-12 DIAGNOSIS — K219 Gastro-esophageal reflux disease without esophagitis: Secondary | ICD-10-CM | POA: Insufficient documentation

## 2018-11-12 DIAGNOSIS — Z79899 Other long term (current) drug therapy: Secondary | ICD-10-CM | POA: Insufficient documentation

## 2018-11-12 HISTORY — PX: ANTERIOR CERVICAL DECOMP/DISCECTOMY FUSION: SHX1161

## 2018-11-12 LAB — POCT PREGNANCY, URINE: Preg Test, Ur: NEGATIVE

## 2018-11-12 SURGERY — ANTERIOR CERVICAL DECOMPRESSION/DISCECTOMY FUSION 2 LEVELS
Anesthesia: General

## 2018-11-12 MED ORDER — ACETAMINOPHEN 650 MG RE SUPP: 650.0000 mg | RECTAL | Status: DC | PRN

## 2018-11-12 MED ORDER — FENTANYL CITRATE (PF) 100 MCG/2ML IJ SOLN: 25.0000 ug | Freq: Once | INTRAMUSCULAR | Status: DC

## 2018-11-12 MED ORDER — ACETAMINOPHEN 325 MG PO TABS: 325.0000 mg | ORAL_TABLET | ORAL | Status: DC | PRN

## 2018-11-12 MED ORDER — ONDANSETRON HCL 4 MG/2ML IJ SOLN
INTRAMUSCULAR | Status: DC | PRN
  Administered 2018-11-12: 4 mg via INTRAVENOUS

## 2018-11-12 MED ORDER — DOCUSATE SODIUM 100 MG PO CAPS: 100.0000 mg | ORAL_CAPSULE | Freq: Every day | ORAL | Status: DC

## 2018-11-12 MED ORDER — THROMBIN 5000 UNITS EX SOLR
CUTANEOUS | Status: AC
  Filled 2018-11-12: qty 15000

## 2018-11-12 MED ORDER — 0.9 % SODIUM CHLORIDE (POUR BTL) OPTIME
TOPICAL | Status: DC | PRN
  Administered 2018-11-12: 08:00:00 1000 mL

## 2018-11-12 MED ORDER — ACETAMINOPHEN 325 MG PO TABS
650.0000 mg | ORAL_TABLET | ORAL | Status: DC | PRN
  Administered 2018-11-12: 650 mg via ORAL
  Filled 2018-11-12: qty 2

## 2018-11-12 MED ORDER — FENTANYL CITRATE (PF) 100 MCG/2ML IJ SOLN
50.0000 ug | Freq: Once | INTRAMUSCULAR | Status: AC
  Administered 2018-11-12: 50 ug via INTRAVENOUS

## 2018-11-12 MED ORDER — CYCLOBENZAPRINE HCL 10 MG PO TABS
ORAL_TABLET | ORAL | Status: AC
  Administered 2018-11-12: 11:00:00 10 mg via ORAL
  Filled 2018-11-12: qty 1

## 2018-11-12 MED ORDER — DEXAMETHASONE SODIUM PHOSPHATE 10 MG/ML IJ SOLN
INTRAMUSCULAR | Status: DC | PRN
  Administered 2018-11-12: 10 mg via INTRAVENOUS

## 2018-11-12 MED ORDER — ALBUTEROL SULFATE HFA 108 (90 BASE) MCG/ACT IN AERS
INHALATION_SPRAY | RESPIRATORY_TRACT | Status: DC | PRN
  Administered 2018-11-12: 2 via RESPIRATORY_TRACT

## 2018-11-12 MED ORDER — MENTHOL 3 MG MT LOZG: 1.0000 | LOZENGE | OROMUCOSAL | Status: DC | PRN

## 2018-11-12 MED ORDER — HYDROMORPHONE HCL 1 MG/ML IJ SOLN
INTRAMUSCULAR | Status: AC
  Administered 2018-11-12: 1 mg via INTRAVENOUS
  Filled 2018-11-12: qty 1

## 2018-11-12 MED ORDER — THROMBIN 5000 UNITS EX SOLR
CUTANEOUS | Status: DC | PRN
  Administered 2018-11-12: 10000 [IU] via TOPICAL

## 2018-11-12 MED ORDER — CYCLOBENZAPRINE HCL 10 MG PO TABS: 10.0000 mg | ORAL_TABLET | Freq: Three times a day (TID) | ORAL | 0 refills | Status: DC | PRN

## 2018-11-12 MED ORDER — LORATADINE 10 MG PO TABS: 10.0000 mg | ORAL_TABLET | Freq: Every day | ORAL | Status: DC

## 2018-11-12 MED ORDER — CHLORHEXIDINE GLUCONATE CLOTH 2 % EX PADS: 6.0000 | MEDICATED_PAD | Freq: Once | CUTANEOUS | Status: DC

## 2018-11-12 MED ORDER — OXYCODONE HCL 5 MG PO TABS: 5.0000 mg | ORAL_TABLET | Freq: Once | ORAL | Status: DC | PRN

## 2018-11-12 MED ORDER — FENTANYL CITRATE (PF) 100 MCG/2ML IJ SOLN
50.0000 ug | Freq: Once | INTRAMUSCULAR | Status: AC
  Administered 2018-11-12: 07:00:00 50 ug via INTRAVENOUS

## 2018-11-12 MED ORDER — VANCOMYCIN HCL 10 G IV SOLR
1500.0000 mg | INTRAVENOUS | Status: AC
  Administered 2018-11-12: 07:00:00 1500 mg via INTRAVENOUS
  Filled 2018-11-12: qty 1500

## 2018-11-12 MED ORDER — ONDANSETRON HCL 4 MG/2ML IJ SOLN: 4.0000 mg | Freq: Four times a day (QID) | INTRAMUSCULAR | Status: DC | PRN

## 2018-11-12 MED ORDER — FENTANYL CITRATE (PF) 100 MCG/2ML IJ SOLN: 25.0000 ug | INTRAMUSCULAR | Status: DC | PRN

## 2018-11-12 MED ORDER — PROPOFOL 10 MG/ML IV BOLUS
INTRAVENOUS | Status: DC | PRN
  Administered 2018-11-12: 150 mg via INTRAVENOUS

## 2018-11-12 MED ORDER — SODIUM CHLORIDE 0.9% FLUSH: 3.0000 mL | INTRAVENOUS | Status: DC | PRN

## 2018-11-12 MED ORDER — SODIUM CHLORIDE 0.9 % IV SOLN
INTRAVENOUS | Status: DC | PRN
  Administered 2018-11-12: 08:00:00 25 ug/min via INTRAVENOUS

## 2018-11-12 MED ORDER — LACTATED RINGERS IV SOLN
INTRAVENOUS | Status: DC
  Administered 2018-11-12 (×2): via INTRAVENOUS

## 2018-11-12 MED ORDER — PROPOFOL 10 MG/ML IV BOLUS
INTRAVENOUS | Status: AC
  Filled 2018-11-12: qty 20

## 2018-11-12 MED ORDER — ROCURONIUM BROMIDE 10 MG/ML (PF) SYRINGE
PREFILLED_SYRINGE | INTRAVENOUS | Status: DC | PRN
  Administered 2018-11-12: 10 mg via INTRAVENOUS
  Administered 2018-11-12: 50 mg via INTRAVENOUS
  Administered 2018-11-12: 20 mg via INTRAVENOUS

## 2018-11-12 MED ORDER — SODIUM CHLORIDE 0.9 % IV SOLN: 250.0000 mL | INTRAVENOUS | Status: DC

## 2018-11-12 MED ORDER — HYDROCODONE-ACETAMINOPHEN 5-325 MG PO TABS
1.0000 | ORAL_TABLET | ORAL | Status: DC | PRN
  Administered 2018-11-12: 11:00:00 1 via ORAL

## 2018-11-12 MED ORDER — OXYCODONE-ACETAMINOPHEN 5-325 MG PO TABS: 1.0000 | ORAL_TABLET | ORAL | Status: DC | PRN

## 2018-11-12 MED ORDER — SUCCINYLCHOLINE CHLORIDE 20 MG/ML IJ SOLN
INTRAMUSCULAR | Status: DC | PRN
  Administered 2018-11-12: 120 mg via INTRAVENOUS

## 2018-11-12 MED ORDER — HYDROMORPHONE HCL 1 MG/ML IJ SOLN
1.0000 mg | INTRAMUSCULAR | Status: DC | PRN
  Administered 2018-11-12: 10:00:00 1 mg via INTRAVENOUS

## 2018-11-12 MED ORDER — PHENOL 1.4 % MT LIQD: 1.0000 | OROMUCOSAL | Status: DC | PRN

## 2018-11-12 MED ORDER — MIDAZOLAM HCL 5 MG/5ML IJ SOLN
INTRAMUSCULAR | Status: DC | PRN
  Administered 2018-11-12: 2 mg via INTRAVENOUS

## 2018-11-12 MED ORDER — FENTANYL CITRATE (PF) 250 MCG/5ML IJ SOLN
INTRAMUSCULAR | Status: DC | PRN
  Administered 2018-11-12: 150 ug via INTRAVENOUS
  Administered 2018-11-12 (×2): 50 ug via INTRAVENOUS

## 2018-11-12 MED ORDER — SODIUM CHLORIDE 0.9 % IV SOLN
INTRAVENOUS | Status: DC | PRN
  Administered 2018-11-12: 08:00:00

## 2018-11-12 MED ORDER — VANCOMYCIN HCL IN DEXTROSE 1-5 GM/200ML-% IV SOLN
1000.0000 mg | Freq: Once | INTRAVENOUS | Status: DC
  Filled 2018-11-12: qty 200

## 2018-11-12 MED ORDER — CYCLOBENZAPRINE HCL 10 MG PO TABS
10.0000 mg | ORAL_TABLET | Freq: Three times a day (TID) | ORAL | Status: DC | PRN
  Administered 2018-11-12: 10 mg via ORAL

## 2018-11-12 MED ORDER — FENTANYL CITRATE (PF) 100 MCG/2ML IJ SOLN
INTRAMUSCULAR | Status: AC
  Administered 2018-11-12: 50 ug via INTRAVENOUS
  Filled 2018-11-12: qty 2

## 2018-11-12 MED ORDER — SUGAMMADEX SODIUM 200 MG/2ML IV SOLN
INTRAVENOUS | Status: DC | PRN
  Administered 2018-11-12: 300 mg via INTRAVENOUS

## 2018-11-12 MED ORDER — MIDAZOLAM HCL 2 MG/2ML IJ SOLN
INTRAMUSCULAR | Status: AC
  Filled 2018-11-12: qty 2

## 2018-11-12 MED ORDER — HYDROCODONE-ACETAMINOPHEN 10-325 MG PO TABS: 2.0000 | ORAL_TABLET | ORAL | Status: DC | PRN

## 2018-11-12 MED ORDER — HYDROCODONE-ACETAMINOPHEN 5-325 MG PO TABS: 1.0000 | ORAL_TABLET | ORAL | 0 refills | Status: DC | PRN

## 2018-11-12 MED ORDER — ALPRAZOLAM 0.25 MG PO TABS: 0.2500 mg | ORAL_TABLET | Freq: Every day | ORAL | Status: DC | PRN

## 2018-11-12 MED ORDER — ACETAMINOPHEN 160 MG/5ML PO SOLN: 325.0000 mg | ORAL | Status: DC | PRN

## 2018-11-12 MED ORDER — HYDROCODONE-ACETAMINOPHEN 5-325 MG PO TABS
ORAL_TABLET | ORAL | Status: AC
  Administered 2018-11-12: 11:00:00 1 via ORAL
  Filled 2018-11-12: qty 1

## 2018-11-12 MED ORDER — MEPERIDINE HCL 25 MG/ML IJ SOLN: 6.2500 mg | INTRAMUSCULAR | Status: DC | PRN

## 2018-11-12 MED ORDER — FENTANYL CITRATE (PF) 250 MCG/5ML IJ SOLN
INTRAMUSCULAR | Status: AC
  Filled 2018-11-12: qty 5

## 2018-11-12 MED ORDER — ONDANSETRON HCL 4 MG PO TABS: 4.0000 mg | ORAL_TABLET | Freq: Four times a day (QID) | ORAL | Status: DC | PRN

## 2018-11-12 MED ORDER — ONDANSETRON HCL 4 MG/2ML IJ SOLN: 4.0000 mg | Freq: Once | INTRAMUSCULAR | Status: DC | PRN

## 2018-11-12 MED ORDER — SODIUM CHLORIDE 0.9% FLUSH: 3.0000 mL | Freq: Two times a day (BID) | INTRAVENOUS | Status: DC

## 2018-11-12 MED ORDER — HEMOSTATIC AGENTS (NO CHARGE) OPTIME
TOPICAL | Status: DC | PRN
  Administered 2018-11-12: 1 via TOPICAL

## 2018-11-12 MED ORDER — OXYCODONE HCL 5 MG/5ML PO SOLN: 5.0000 mg | Freq: Once | ORAL | Status: DC | PRN

## 2018-11-12 MED ORDER — LIDOCAINE 2% (20 MG/ML) 5 ML SYRINGE
INTRAMUSCULAR | Status: DC | PRN
  Administered 2018-11-12: 80 mg via INTRAVENOUS

## 2018-11-12 SURGICAL SUPPLY — 57 items
APL SKNCLS STERI-STRIP NONHPOA (GAUZE/BANDAGES/DRESSINGS) ×1
BAG DECANTER FOR FLEXI CONT (MISCELLANEOUS) ×3 IMPLANT
BENZOIN TINCTURE PRP APPL 2/3 (GAUZE/BANDAGES/DRESSINGS) ×3 IMPLANT
BIT DRILL 13 (BIT) ×1 IMPLANT
BIT DRILL 13MM (BIT) ×1
BUR MATCHSTICK NEURO 3.0 LAGG (BURR) ×3 IMPLANT
CAGE PEEK 7X14X11 (Cage) ×3 IMPLANT
CAGE PEEK 8X14X11 (Cage) ×2 IMPLANT
CANISTER SUCT 3000ML PPV (MISCELLANEOUS) ×3 IMPLANT
CARTRIDGE OIL MAESTRO DRILL (MISCELLANEOUS) ×1 IMPLANT
CLOSURE WOUND 1/2 X4 (GAUZE/BANDAGES/DRESSINGS) ×1
COVER WAND RF STERILE (DRAPES) ×1 IMPLANT
DIFFUSER DRILL AIR PNEUMATIC (MISCELLANEOUS) ×3 IMPLANT
DRAPE C-ARM 42X72 X-RAY (DRAPES) ×6 IMPLANT
DRAPE LAPAROTOMY 100X72 PEDS (DRAPES) ×3 IMPLANT
DRAPE MICROSCOPE LEICA (MISCELLANEOUS) ×3 IMPLANT
DRSG OPSITE POSTOP 4X6 (GAUZE/BANDAGES/DRESSINGS) ×2 IMPLANT
DURAPREP 6ML APPLICATOR 50/CS (WOUND CARE) ×3 IMPLANT
ELECT COATED BLADE 2.86 ST (ELECTRODE) ×3 IMPLANT
ELECT REM PT RETURN 9FT ADLT (ELECTROSURGICAL) ×3
ELECTRODE REM PT RTRN 9FT ADLT (ELECTROSURGICAL) ×1 IMPLANT
GAUZE 4X4 16PLY RFD (DISPOSABLE) IMPLANT
GAUZE SPONGE 4X4 12PLY STRL (GAUZE/BANDAGES/DRESSINGS) ×3 IMPLANT
GLOVE ECLIPSE 9.0 STRL (GLOVE) ×3 IMPLANT
GLOVE EXAM NITRILE XL STR (GLOVE) IMPLANT
GOWN STRL REUS W/ TWL LRG LVL3 (GOWN DISPOSABLE) IMPLANT
GOWN STRL REUS W/ TWL XL LVL3 (GOWN DISPOSABLE) ×1 IMPLANT
GOWN STRL REUS W/TWL 2XL LVL3 (GOWN DISPOSABLE) IMPLANT
GOWN STRL REUS W/TWL LRG LVL3 (GOWN DISPOSABLE)
GOWN STRL REUS W/TWL XL LVL3 (GOWN DISPOSABLE) ×3
HALTER HD/CHIN CERV TRACTION D (MISCELLANEOUS) ×3 IMPLANT
HEMOSTAT POWDER KIT SURGIFOAM (HEMOSTASIS) IMPLANT
HEMOSTAT SURGICEL 2X14 (HEMOSTASIS) IMPLANT
KIT BASIN OR (CUSTOM PROCEDURE TRAY) ×3 IMPLANT
KIT TURNOVER KIT B (KITS) ×3 IMPLANT
NDL SPNL 20GX3.5 QUINCKE YW (NEEDLE) ×1 IMPLANT
NEEDLE SPNL 20GX3.5 QUINCKE YW (NEEDLE) ×3 IMPLANT
NS IRRIG 1000ML POUR BTL (IV SOLUTION) ×3 IMPLANT
OIL CARTRIDGE MAESTRO DRILL (MISCELLANEOUS) ×3
PACK LAMINECTOMY NEURO (CUSTOM PROCEDURE TRAY) ×3 IMPLANT
PAD ARMBOARD 7.5X6 YLW CONV (MISCELLANEOUS) ×9 IMPLANT
PLATE 45MM (Plate) ×3 IMPLANT
PLATE 45XATL VS ELT (Plate) IMPLANT
RUBBERBAND STERILE (MISCELLANEOUS) ×6 IMPLANT
SCREW ST FIX 4 ATL 3120213 (Screw) ×12 IMPLANT
SPACER SPNL 11X14X7XPEEK CVD (Cage) IMPLANT
SPCR SPNL 11X14X7XPEEK CVD (Cage) ×1 IMPLANT
SPONGE INTESTINAL PEANUT (DISPOSABLE) ×3 IMPLANT
SPONGE SURGIFOAM ABS GEL SZ50 (HEMOSTASIS) ×3 IMPLANT
STRIP CLOSURE SKIN 1/2X4 (GAUZE/BANDAGES/DRESSINGS) ×2 IMPLANT
SUT VIC AB 3-0 SH 8-18 (SUTURE) ×3 IMPLANT
SUT VIC AB 4-0 RB1 18 (SUTURE) ×3 IMPLANT
TAPE CLOTH 4X10 WHT NS (GAUZE/BANDAGES/DRESSINGS) ×3 IMPLANT
TOWEL GREEN STERILE (TOWEL DISPOSABLE) ×3 IMPLANT
TOWEL GREEN STERILE FF (TOWEL DISPOSABLE) ×3 IMPLANT
TRAP SPECIMEN MUCOUS 40CC (MISCELLANEOUS) ×3 IMPLANT
WATER STERILE IRR 1000ML POUR (IV SOLUTION) ×3 IMPLANT

## 2018-11-12 NOTE — Transfer of Care (Signed)
Immediate Anesthesia Transfer of Care Note  Patient: Elizabeth Mcclure  Procedure(s) Performed: ANTERIOR CERVICAL DECOMPRESSION/DISCECTOMY FUSION CERVICAL FIVE- CERVICAL SIX, CERVICAL SIX- CERVICAL SEVEN (N/A )  Patient Location: PACU  Anesthesia Type:General  Level of Consciousness: awake, alert  and oriented  Airway & Oxygen Therapy: Patient Spontanous Breathing and Patient connected to face mask oxygen  Post-op Assessment: Report given to RN, Post -op Vital signs reviewed and stable and Patient moving all extremities X 4  Post vital signs: Reviewed and stable  Last Vitals:  Vitals Value Taken Time  BP    Temp    Pulse 86 11/12/2018  9:59 AM  Resp 16 11/12/2018  9:59 AM  SpO2 100 % 11/12/2018  9:59 AM  Vitals shown include unvalidated device data.  Last Pain:  Vitals:   11/12/18 0603  TempSrc:   PainSc: 10-Worst pain ever         Complications: No apparent anesthesia complications

## 2018-11-12 NOTE — Progress Notes (Signed)
Orthopedic Tech Progress Note Patient Details:  Elizabeth Mcclure December 10, 1965 [id number]  Ortho Devices Type of Ortho Device: Soft collar Ortho Device/Splint Interventions: Application   Post Interventions Patient Tolerated: Well Instructions Provided: Care of device   [place] Pink 11/12/2018, 10:11 AM

## 2018-11-12 NOTE — Discharge Instructions (Signed)

## 2018-11-12 NOTE — Progress Notes (Signed)
Patient arrived to the unit from PACU alert and  oriented X 4 C/o 5/10 Pain, Nurse will treat with PRN medications Skin clean dry and intact Right cervical surgical incision, honeycomb dressing clean dry and intact. Collar in place L and R moderate Grips No numbness or tingling Nurse will continue to monitor All questions and concerns address  Bed in the lowest position, bed alarm set with call light in reach Only Cloths, shoes and cell phone at bedside.

## 2018-11-12 NOTE — Brief Op Note (Signed)
11/12/2018  9:55 AM  PATIENT:  Zella Richer  53 y.o. 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  PROCEDURE:  Procedure(s) with comments: ANTERIOR CERVICAL DECOMPRESSION/DISCECTOMY FUSION CERVICAL FIVE- CERVICAL SIX, CERVICAL SIX- CERVICAL SEVEN (N/A) - ANTERIOR CERVICAL DECOMPRESSION/DISCECTOMY FUSION CERVICAL FIVE- CERVICAL SIX, CERVICAL SIX- CERVICAL SEVEN  SURGEON:  Surgeon(s) and Role:    * Earnie Larsson, MD - Primary  PHYSICIAN ASSISTANT:   ASSISTANTSReinaldo Meeker, NP   ANESTHESIA:   general  EBL:  50 mL   BLOOD ADMINISTERED:none  DRAINS: none   LOCAL MEDICATIONS USED:  NONE  SPECIMEN:  No Specimen  DISPOSITION OF SPECIMEN:  N/A  COUNTS:  YES  TOURNIQUET:  * No tourniquets in log *  DICTATION: .Dragon Dictation  PLAN OF CARE: Admit to inpatient   PATIENT DISPOSITION:  PACU - hemodynamically stable.   Delay start of Pharmacological VTE agent (>24hrs) due to surgical blood loss or risk of bleeding: yes

## 2018-11-12 NOTE — Anesthesia Postprocedure Evaluation (Signed)
Anesthesia Post Note  Patient: MASHANDA ISHIBASHI  Procedure(s) Performed: ANTERIOR CERVICAL DECOMPRESSION/DISCECTOMY FUSION CERVICAL FIVE- CERVICAL SIX, CERVICAL SIX- CERVICAL SEVEN (N/A )     Patient location during evaluation: PACU Anesthesia Type: General Level of consciousness: awake and alert Pain management: pain level controlled Vital Signs Assessment: post-procedure vital signs reviewed and stable Respiratory status: spontaneous breathing, nonlabored ventilation, respiratory function stable and patient connected to nasal cannula oxygen Cardiovascular status: blood pressure returned to baseline and stable Postop Assessment: no apparent nausea or vomiting Anesthetic complications: no    Last Vitals:  Vitals:   11/12/18 1125 11/12/18 1148  BP: 140/80 139/88  Pulse: (!) 57 60  Resp: 12 20  Temp:  36.6 C  SpO2: 94% 94%    Last Pain:  Vitals:   11/12/18 1148  TempSrc: Oral  PainSc:                  Oree Hislop

## 2018-11-12 NOTE — Op Note (Signed)
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  Asst. Surgeon: Reinaldo Meeker, NP  Anesthesia: General  Indication: 53 year old female with severe neck and right upper extremity pain paresthesias and weakness consistent with a severe cervical radiculopathy.  Work-up demonstrates evidence of a broad-based disc herniation with stenosis and cord compression at C5-6 off to the left.  At C6-7 the patient has evidence of a very large right-sided disc herniation extending behind the body of C7 and causing marked compression of the exiting C7 nerve root and extending all the way down to the C8 nerve root.  Patient presents now for two-level anterior cervical decompression and fusion in hopes of improving her symptoms.  Operative note: After induction anesthesia, patient position supine with neck slightly extended and held placed halter traction.  Patient's anterior cervical region prepped and draped sterilely.  Incision made overlying C6.  Dissection performed on the right.  Retractor placed.  Fluoroscopy used.  Levels confirmed.  The spaces incised at both levels.  Discectomy was then performed using various instruments down to level the posterior annulus.  Microscope brought in field use throughout the remainder of the discectomy.  Remaining aspects of annulus and osteophytes removed using high-speed drill.  Posterior logical was elevated and resected.  Underlying thecal sac was identified.  Wide central decompression then performed undercutting the bodies of C5 and C6.  Decompression then proceeded into each neural foramina.  Wide anterior foraminotomies were performed  on the course exiting C6 nerve roots bilaterally.  At this point a very thorough decompression had been achieved.  There was no evidence of injury to thecal sac or nerve roots.  Wound is then irrigated fanlike solution.  Gelfoam was placed topically for hemostasis.  Discectomy was then performed the C6-7 level in a similar fashion.  On the right side at C6-7 the C7 vertebral body was further undercut and a large amount of disc herniation was evacuated from behind the body of C7.  At this point there is no evidence of injury to thecal sac or nerve roots.  There is no evidence of any residual compression.  I felt comfortable that the disc material removed was the majority if not the entirety of the disc herniation visualized on the MRI scan.  Gelfoam was placed topically at this level.  [employer] anatomic peek cages were then packed with locally harvested autograft.  Each cage was then impacted in place at both C5-6 and C6-7.  Anterior cervical plate was then placed from the C5-C7 level.  This then attached under fluoroscopic guidance using 13 Miller fixed angle screws to each at all 3 levels.  All 6 screws given final tightening found to be solid within the bone.  Locking screws engaged in all levels.  Final images reveal good positioning of the cages and the hardware at the proper operative level with normal alignment of spine.  Wound is irrigated one final time.  Hemostasis was assured.  Wounds and closed in layers of Vicryl sutures.  Steri-Strips and sterile dressing were applied.  No apparent complications.  Patient tolerated the procedure well and she returns to the recovery room postop

## 2018-11-12 NOTE — H&P (Signed)
Elizabeth Mcclure is an 53 y.o. female.   Chief Complaint: Severe right upper extremity pain HPI: 53 year old female with severe pain beginning approximately 1 month ago without any precipitating event.  Pain begins in her right side of her neck extends into her scapular region and extends down her right upper extremity.  She has associated sensory loss and weakness in the right upper extremity.  She has minimal if any left-sided symptoms.  She has no lower extremity dysfunction.  She has been treated with anti-inflammatory issue not improvement.  Work-up demonstrates evidence of a moderately large paracentral disc herniation with spinal cord compression on the left at C5-6 and a very large right-sided C6-7 disc herniation with caudal migration behind the body of C7.  Patient presents now for two-level anterior cervical decompression and fusion in hopes of improving her symptoms.  Past Medical History:  Diagnosis Date  . Anxiety   . Arthritis   . Carpal tunnel syndrome   . Constipation   . Depression   . GERD (gastroesophageal reflux disease)   . 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 Joint pain   . Pneumonia    hx of twice  . Varicose veins   . Varicose veins of right leg with edema    pt states has had vein closure twice in this leg saw Dr Early 5 years ago pt states has not seen since edema decreases with elevation     Past Surgical History:  Procedure Laterality Date  . ablation right great sapheous vein      02/09/2008  . CHOLECYSTECTOMY    . COLONOSCOPY  05/24/2012   Procedure: COLONOSCOPY;  Surgeon: Danie Binder, MD;  Location: [facility] SUITE;  Service: Endoscopy;  Laterality: N/A;  9:30  . DILATION AND CURETTAGE OF UTERUS    . JOINT REPLACEMENT    . left knee arthroscopy    . stab phlebectomy      02/2008  . TOTAL KNEE ARTHROPLASTY Left 01/18/2015   Procedure: LEFT TOTAL KNEE ARTHROPLASTY;  Surgeon: Rod Can, MD;  Location: [facility]  ORS;  Service: Orthopedics;  Laterality: Left;  . vein closure     X2, needs vein stripping  . VEIN LIGATION AND STRIPPING Right 07/25/2016   Procedure: LIGATION AND STRIPPING GREATER SAPHENOUS VEIN; 10-20 STAB PHLEBECTOMY;  Surgeon: Rosetta Posner, MD;  Location[facility] OR;  Service: Vascular;  Laterality: Right;  . venous duplex      hx of     Family History  Problem Relation Age of Onset  . Colon cancer Mother 99       passed away this year, 10/28/2011, day after Mother's day.   . Arthritis Father   . Hypertension Father   . Heart disease Father   . Heart disease Brother   . Cancer Sister        cervical  . Heart disease Sister    Social History:  reports that she has been smoking cigarettes. She has a 0.75 pack-year smoking history. She has never used smokeless tobacco. She reports current alcohol use. She reports that she does not use drugs.  Allergies:  Allergies  Allergen Reactions  . Benadryl [Diphenhydramine] Anaphylaxis and Hives  . Penicillins Hives    Has patient had a PCN reaction causing immediate rash, facial/tongue/throat swelling, SOB or lightheadedness with hypotension:unsure Has patient had a PCN reaction causing severe rash involving mucus membranes or skin necrosis:unsure Has patient had  a PCN reaction that required hospitalization:No Has patient had a PCN reaction occurring within the last 10 years:No If all of the above answers are "NO", then may proceed with Cephalosporin use.     Medications Prior to Admission  Medication Sig Dispense Refill  . ALPRAZolam (XANAX) 0.25 MG tablet Take 0.25 mg by mouth daily as needed for anxiety.     . cetirizine (ZYRTEC) 10 MG tablet Take 10 mg by mouth daily.    Marland Kitchen docusate sodium (COLACE) 100 MG capsule Take 100 mg by mouth at bedtime.    Marland Kitchen oxyCODONE-acetaminophen (PERCOCET/ROXICET) 5-325 MG tablet Take 1-2 tablets by mouth every 4 (four) hours as needed (pain).       Results for orders placed or performed during the hospital  encounter of 11/12/18 (from the past 48 hour(s))  Pregnancy, urine POC     Status: None   Collection Time: 11/12/18  6:17 AM  Result Value Ref Range   Preg Test, Ur NEGATIVE NEGATIVE    Comment:        THE SENSITIVITY OF THIS METHODOLOGY IS >24 mIU/mL    No results found.  Pertinent items noted in HPI and remainder of comprehensive ROS otherwise negative.  Blood pressure (!) 157/90, pulse 73, temperature 97.9 F (36.6 C), temperature source Oral, resp. rate 18, height 5\' 7"  (1.702 m), weight (!) 137 kg, SpO2 99 %.  Patient is awake and alert.  She is oriented and appropriate.  She is obviously in a great deal discomfort.  Examination head ears eyes nose throat some are clear chest and abdomen are obese but otherwise benign.  Extremities are free from injury or deformity.  Neurologically she is awake and alert.  Cranial nerve function normal bilateral.  Motor examination with marked weakness of her right triceps muscle group grading out at 1-2 over 5.  She has weakness of her right grip and right intrinsics grading out at 4-/5.  She has normal strength otherwise.  Sensory examination decreased sensation pinprick light touch in her right C7 and C8 dermatomes.  Reflexes are mildly hyperactive in her right lower extremity otherwise normal.  Gait is normal.  Posture is normal. 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  Plan C5-6, C6-7 anterior cervical discectomy and fusion utilizing interbody cage, local harvested autograft, and anterior plate instrumentation.  Risks and benefits of been explained.  Patient wishes to proceed.  She recognizes that due to the nature of her disc herniation at C6-7 that partial or even possible complete corpectomy of C7 may be necessary.  She agrees to proceed.  Cooper Render Le Faulcon 11/12/2018, 7:33 AM

## 2018-11-12 NOTE — Anesthesia Procedure Notes (Signed)
Procedure Name: Intubation Date/Time: 11/12/2018 8:01 AM Performed by: Mariea Clonts, CRNA Pre-anesthesia Checklist: Patient identified, Emergency Drugs available, Suction available and Patient being monitored Patient Re-evaluated:Patient Re-evaluated prior to induction Oxygen Delivery Method: Circle System Utilized Preoxygenation: Pre-oxygenation with 100% oxygen Induction Type: IV induction Laryngoscope Size: Glidescope and 4 Grade View: Grade I Tube type: Oral Number of attempts: 1 Airway Equipment and Method: Stylet,  Oral airway and Video-laryngoscopy Placement Confirmation: ETT inserted through vocal cords under direct vision,  positive ETCO2 and breath sounds checked- equal and bilateral Tube secured with: Tape Dental Injury: Teeth and Oropharynx as per pre-operative assessment

## 2018-11-12 NOTE — Progress Notes (Signed)
Pharmacy Antibiotic Note  Elizabeth Mcclure is a 53 y.o. female admitted on 11/12/2018 for spinal discectomy with fusion.  Pharmacy has been consulted for vancomycin dosing for surgical prophylaxis.  Patient does not have a drain per charting.  She received pre-op vancomycin around 0700 today.  SCr 0.84, nCrCL 89 ml/min.  Plan: Vanc 1gm IV x 1 tonight Pharmacy will sign off.  Height: 5\' 7"  (170.2 cm) Weight: (!) 302 lb (137 kg) IBW/kg (Calculated) : 61.6  Temp (24hrs), Avg:97.7 F (36.5 C), Min:97 F (36.1 C), Max:98.3 F (36.8 C)  Recent Labs  Lab 11/11/18 1348  WBC 7.1  CREATININE 0.84    Estimated Creatinine Clearance: 113.5 mL/min (by C-G formula based on SCr of 0.84 mg/dL).    Allergies  Allergen Reactions  . Benadryl [Diphenhydramine] Anaphylaxis and Hives  . Penicillins Hives    Has patient had a PCN reaction causing immediate rash, facial/tongue/throat swelling, SOB or lightheadedness with hypotension:unsure Has patient had a PCN reaction causing severe rash involving mucus membranes or skin necrosis:unsure Has patient had a PCN reaction that required hospitalization:No Has patient had a PCN reaction occurring within the last 10 years:No If all of the above answers are "NO", then may proceed with Cephalosporin use.     Modupe Shampine D. Mina Marble, PharmD, BCPS, [place] 11/12/2018, 12:45 PM

## 2018-11-12 NOTE — Discharge Summary (Signed)
Physician Discharge Summary  Patient ID: ALEXAS BASULTO MRN: [id number] DOB/AGE: [birth year] 53 y.o.  Admit date: 11/12/2018 Discharge date: 11/12/2018  Admission Diagnoses:  Discharge Diagnoses:  Active Problems:   HNP (herniated nucleus pulposus), cervical   Discharged Condition: good  Hospital Course: Patient admitted to the hospital where she underwent uncomplicated two-level anterior cervical decompression and fusion treatment of her symptomatic cervical disc herniations.  Postop Truman Hayward doing very well.  Preoperative neck and upper extremity pain resolved.  Standing and ambulating and voiding without difficulty.  Ready for discharge home.  Consults:   Significant Diagnostic Studies:   Treatments:   Discharge Exam: Blood pressure 139/88, pulse 60, temperature 97.9 F (36.6 C), temperature source Oral, resp. rate 20, height 5\' 7"  (1.702 m), weight (!) 137 kg, SpO2 94 %. Awake and alert.  Oriented and appropriate.  Cranial nerve function intact.  Motor and sensory function extremities normal.  Wound clean and dry.  Chest and abdomen benign.  Disposition: Discharge disposition: 01-Home or Self Care        Allergies as of 11/12/2018      Reactions   Benadryl [diphenhydramine] Anaphylaxis, Hives   Penicillins Hives   Has patient had a PCN reaction causing immediate rash, facial/tongue/throat swelling, SOB or lightheadedness with hypotension:unsure Has patient had a PCN reaction causing severe rash involving mucus membranes or skin necrosis:unsure Has patient had a PCN reaction that required hospitalization:No Has patient had a PCN reaction occurring within the last 10 years:No If all of the above answers are "NO", then may proceed with Cephalosporin use.      Medication List    TAKE these medications   ALPRAZolam 0.25 MG tablet Commonly known as:  XANAX Take 0.25 mg by mouth daily as needed for anxiety.   cetirizine 10 MG tablet Commonly known as:  ZYRTEC Take 10 mg by  mouth daily.   cyclobenzaprine 10 MG tablet Commonly known as:  FLEXERIL Take 1 tablet (10 mg total) by mouth 3 (three) times daily as needed for muscle spasms.   docusate sodium 100 MG capsule Commonly known as:  COLACE Take 100 mg by mouth at bedtime.   HYDROcodone-acetaminophen 5-325 MG tablet Commonly known as:  NORCO/VICODIN Take 1 tablet by mouth every 4 (four) hours as needed for moderate pain ((score 4 to 6)).   oxyCODONE-acetaminophen 5-325 MG tablet Commonly known as:  PERCOCET/ROXICET Take 1-2 tablets by mouth every 4 (four) hours as needed (pain).        Signed: Cooper Render Krystena Reitter 11/12/2018, 2:53 PM

## 2018-11-18 ENCOUNTER — Encounter (HOSPITAL_COMMUNITY): Payer: Self-pay | Admitting: Neurosurgery

## 2018-11-23 DIAGNOSIS — Z981 Arthrodesis status: Secondary | ICD-10-CM | POA: Insufficient documentation

## 2019-10-04 DIAGNOSIS — M25561 Pain in right knee: Secondary | ICD-10-CM | POA: Insufficient documentation

## 2020-01-06 DIAGNOSIS — W57XXXA Bitten or stung by nonvenomous insect and other nonvenomous arthropods, initial encounter: Secondary | ICD-10-CM | POA: Diagnosis not present

## 2020-01-06 DIAGNOSIS — M549 Dorsalgia, unspecified: Secondary | ICD-10-CM | POA: Diagnosis not present

## 2020-01-06 DIAGNOSIS — I739 Peripheral vascular disease, unspecified: Secondary | ICD-10-CM | POA: Diagnosis not present

## 2020-01-06 DIAGNOSIS — Z299 Encounter for prophylactic measures, unspecified: Secondary | ICD-10-CM | POA: Diagnosis not present

## 2020-02-14 DIAGNOSIS — Z1331 Encounter for screening for depression: Secondary | ICD-10-CM | POA: Diagnosis not present

## 2020-02-14 DIAGNOSIS — E7800 Pure hypercholesterolemia, unspecified: Secondary | ICD-10-CM | POA: Diagnosis not present

## 2020-02-14 DIAGNOSIS — Z7189 Other specified counseling: Secondary | ICD-10-CM | POA: Diagnosis not present

## 2020-02-14 DIAGNOSIS — Z1339 Encounter for screening examination for other mental health and behavioral disorders: Secondary | ICD-10-CM | POA: Diagnosis not present

## 2020-02-14 DIAGNOSIS — Z6841 Body Mass Index (BMI) 40.0 and over, adult: Secondary | ICD-10-CM | POA: Diagnosis not present

## 2020-02-14 DIAGNOSIS — Z299 Encounter for prophylactic measures, unspecified: Secondary | ICD-10-CM | POA: Diagnosis not present

## 2020-02-14 DIAGNOSIS — Z0000 Encounter for general adult medical examination without abnormal findings: Secondary | ICD-10-CM | POA: Diagnosis not present

## 2020-02-14 DIAGNOSIS — R5383 Other fatigue: Secondary | ICD-10-CM | POA: Diagnosis not present

## 2020-02-14 DIAGNOSIS — Z79899 Other long term (current) drug therapy: Secondary | ICD-10-CM | POA: Diagnosis not present

## 2020-02-24 DIAGNOSIS — Z6841 Body Mass Index (BMI) 40.0 and over, adult: Secondary | ICD-10-CM | POA: Diagnosis not present

## 2020-02-24 DIAGNOSIS — F419 Anxiety disorder, unspecified: Secondary | ICD-10-CM | POA: Diagnosis not present

## 2020-02-24 DIAGNOSIS — R0789 Other chest pain: Secondary | ICD-10-CM | POA: Diagnosis not present

## 2020-02-24 DIAGNOSIS — I739 Peripheral vascular disease, unspecified: Secondary | ICD-10-CM | POA: Diagnosis not present

## 2020-02-24 DIAGNOSIS — Z299 Encounter for prophylactic measures, unspecified: Secondary | ICD-10-CM | POA: Diagnosis not present

## 2020-07-02 IMAGING — RF CERVICAL SPINE - 2-3 VIEW
1 series · 2 of 2 positions shown · non-contrast
Comparison: MRI November 09, 2018.

CLINICAL DATA: Surgical anterior cervical fusion.

EXAM:
DG C-ARM 61-120 MIN; CERVICAL SPINE - 2-3 VIEW
FLUOROSCOPY TIME:  15 seconds.

[Series 1: run · 2 of 2 slices shown]
[im 1/2]
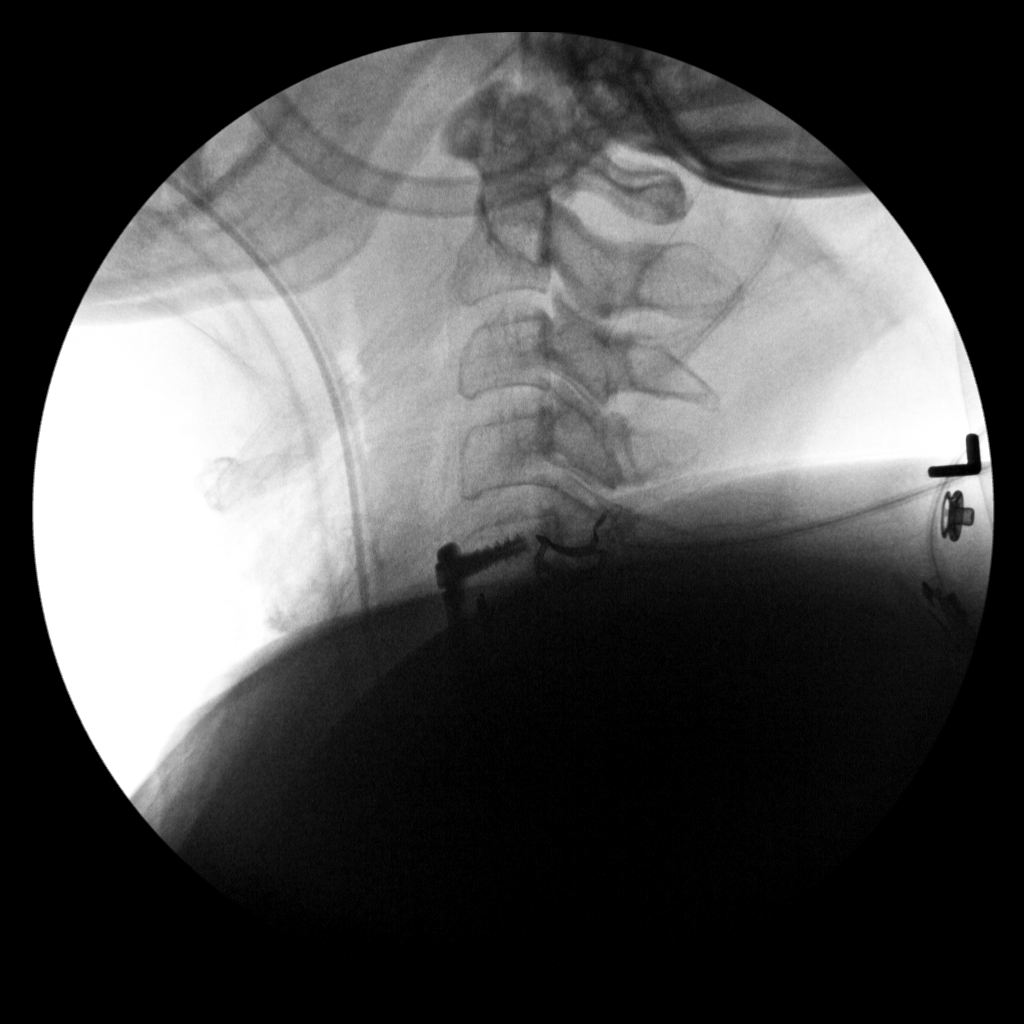
[im 2/2]
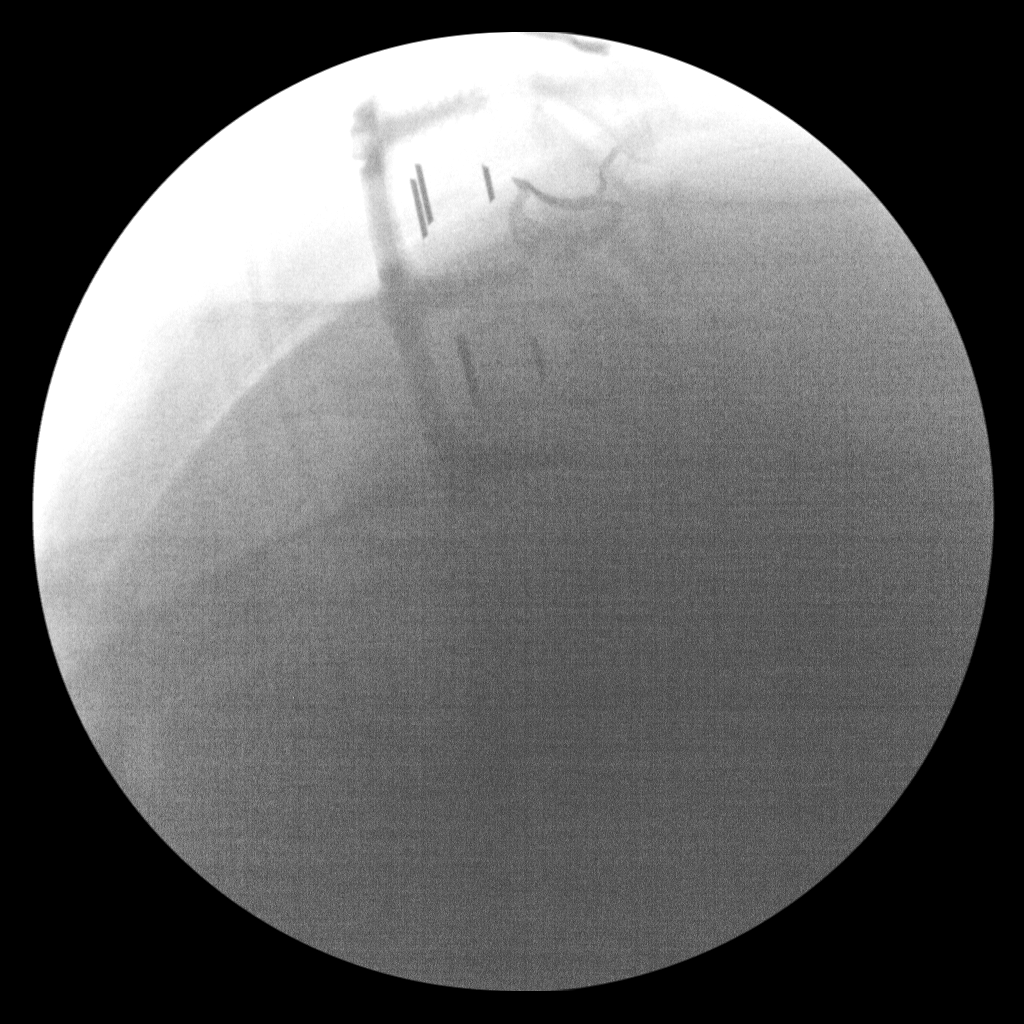

[2 of 2 positions shown; findings below may reference images not displayed]

FINDINGS: Two intraoperative fluoroscopic images of the cervical spine
demonstrate the patient be status post surgical anterior fusion of
C5-6 and C6-7. Good alignment of vertebral bodies is noted.
IMPRESSION: Status post surgical anterior fusion of C5-6 and C6-7.

## 2020-07-11 DIAGNOSIS — M48061 Spinal stenosis, lumbar region without neurogenic claudication: Secondary | ICD-10-CM | POA: Diagnosis not present

## 2020-07-11 DIAGNOSIS — M5459 Other low back pain: Secondary | ICD-10-CM | POA: Diagnosis not present

## 2020-07-31 DIAGNOSIS — Z6841 Body Mass Index (BMI) 40.0 and over, adult: Secondary | ICD-10-CM | POA: Diagnosis not present

## 2020-07-31 DIAGNOSIS — R030 Elevated blood-pressure reading, without diagnosis of hypertension: Secondary | ICD-10-CM | POA: Diagnosis not present

## 2020-07-31 DIAGNOSIS — M5416 Radiculopathy, lumbar region: Secondary | ICD-10-CM | POA: Diagnosis not present

## 2020-08-02 ENCOUNTER — Other Ambulatory Visit: Payer: Self-pay | Admitting: Neurosurgery

## 2020-08-02 DIAGNOSIS — M5416 Radiculopathy, lumbar region: Secondary | ICD-10-CM

## 2020-08-23 ENCOUNTER — Other Ambulatory Visit: Payer: BC Managed Care – PPO

## 2020-09-08 ENCOUNTER — Other Ambulatory Visit: Payer: BC Managed Care – PPO

## 2020-09-19 DIAGNOSIS — B3781 Candidal esophagitis: Secondary | ICD-10-CM | POA: Diagnosis not present

## 2020-09-19 DIAGNOSIS — Z6841 Body Mass Index (BMI) 40.0 and over, adult: Secondary | ICD-10-CM | POA: Diagnosis not present

## 2020-09-19 DIAGNOSIS — I739 Peripheral vascular disease, unspecified: Secondary | ICD-10-CM | POA: Diagnosis not present

## 2020-09-19 DIAGNOSIS — Z87891 Personal history of nicotine dependence: Secondary | ICD-10-CM | POA: Diagnosis not present

## 2020-09-19 DIAGNOSIS — F419 Anxiety disorder, unspecified: Secondary | ICD-10-CM | POA: Diagnosis not present

## 2020-09-19 DIAGNOSIS — Z299 Encounter for prophylactic measures, unspecified: Secondary | ICD-10-CM | POA: Diagnosis not present

## 2020-09-26 ENCOUNTER — Other Ambulatory Visit: Payer: BC Managed Care – PPO

## 2020-12-13 DIAGNOSIS — L309 Dermatitis, unspecified: Secondary | ICD-10-CM | POA: Diagnosis not present

## 2020-12-13 DIAGNOSIS — L299 Pruritus, unspecified: Secondary | ICD-10-CM | POA: Diagnosis not present

## 2020-12-13 DIAGNOSIS — L570 Actinic keratosis: Secondary | ICD-10-CM | POA: Diagnosis not present

## 2021-01-11 DIAGNOSIS — Z9660 Presence of unspecified orthopedic joint implant: Secondary | ICD-10-CM | POA: Diagnosis not present

## 2021-01-11 DIAGNOSIS — R9431 Abnormal electrocardiogram [ECG] [EKG]: Secondary | ICD-10-CM | POA: Diagnosis not present

## 2021-01-11 DIAGNOSIS — M1990 Unspecified osteoarthritis, unspecified site: Secondary | ICD-10-CM | POA: Diagnosis not present

## 2021-01-11 DIAGNOSIS — R072 Precordial pain: Secondary | ICD-10-CM | POA: Diagnosis not present

## 2021-01-11 DIAGNOSIS — Z9049 Acquired absence of other specified parts of digestive tract: Secondary | ICD-10-CM | POA: Diagnosis not present

## 2021-01-11 DIAGNOSIS — Z5329 Procedure and treatment not carried out because of patient's decision for other reasons: Secondary | ICD-10-CM | POA: Diagnosis not present

## 2021-01-11 DIAGNOSIS — R079 Chest pain, unspecified: Secondary | ICD-10-CM | POA: Diagnosis not present

## 2021-01-14 DIAGNOSIS — I739 Peripheral vascular disease, unspecified: Secondary | ICD-10-CM | POA: Diagnosis not present

## 2021-01-14 DIAGNOSIS — Z299 Encounter for prophylactic measures, unspecified: Secondary | ICD-10-CM | POA: Diagnosis not present

## 2021-01-14 DIAGNOSIS — R079 Chest pain, unspecified: Secondary | ICD-10-CM | POA: Diagnosis not present

## 2021-01-14 DIAGNOSIS — F419 Anxiety disorder, unspecified: Secondary | ICD-10-CM | POA: Diagnosis not present

## 2021-01-24 DIAGNOSIS — R079 Chest pain, unspecified: Secondary | ICD-10-CM | POA: Diagnosis not present

## 2021-01-29 DIAGNOSIS — R079 Chest pain, unspecified: Secondary | ICD-10-CM | POA: Diagnosis not present

## 2021-04-05 DIAGNOSIS — R0600 Dyspnea, unspecified: Secondary | ICD-10-CM | POA: Diagnosis not present

## 2021-04-05 DIAGNOSIS — I739 Peripheral vascular disease, unspecified: Secondary | ICD-10-CM | POA: Diagnosis not present

## 2021-04-05 DIAGNOSIS — R0683 Snoring: Secondary | ICD-10-CM | POA: Diagnosis not present

## 2021-04-05 DIAGNOSIS — D582 Other hemoglobinopathies: Secondary | ICD-10-CM | POA: Diagnosis not present

## 2021-04-05 DIAGNOSIS — Z299 Encounter for prophylactic measures, unspecified: Secondary | ICD-10-CM | POA: Diagnosis not present

## 2021-04-09 DIAGNOSIS — Z6841 Body Mass Index (BMI) 40.0 and over, adult: Secondary | ICD-10-CM | POA: Diagnosis not present

## 2021-04-09 DIAGNOSIS — Z7189 Other specified counseling: Secondary | ICD-10-CM | POA: Diagnosis not present

## 2021-04-09 DIAGNOSIS — Z299 Encounter for prophylactic measures, unspecified: Secondary | ICD-10-CM | POA: Diagnosis not present

## 2021-04-09 DIAGNOSIS — Z1331 Encounter for screening for depression: Secondary | ICD-10-CM | POA: Diagnosis not present

## 2021-04-09 DIAGNOSIS — Z2821 Immunization not carried out because of patient refusal: Secondary | ICD-10-CM | POA: Diagnosis not present

## 2021-04-09 DIAGNOSIS — Z1339 Encounter for screening examination for other mental health and behavioral disorders: Secondary | ICD-10-CM | POA: Diagnosis not present

## 2021-04-09 DIAGNOSIS — R5383 Other fatigue: Secondary | ICD-10-CM | POA: Diagnosis not present

## 2021-04-09 DIAGNOSIS — E7800 Pure hypercholesterolemia, unspecified: Secondary | ICD-10-CM | POA: Diagnosis not present

## 2021-04-09 DIAGNOSIS — Z0000 Encounter for general adult medical examination without abnormal findings: Secondary | ICD-10-CM | POA: Diagnosis not present

## 2021-04-09 DIAGNOSIS — F339 Major depressive disorder, recurrent, unspecified: Secondary | ICD-10-CM | POA: Diagnosis not present

## 2021-05-14 ENCOUNTER — Encounter: Payer: Self-pay | Admitting: *Deleted

## 2021-05-14 ENCOUNTER — Encounter: Payer: Self-pay | Admitting: Cardiology

## 2021-05-14 NOTE — Progress Notes (Signed)
Cardiology Office Note  Date: 05/15/2021   ID: Elizabeth Mcclure, DOB 01-23-1966, MRN [id number]  PCP:  Monico Blitz, MD  Cardiologist:  Rozann Lesches, MD Electrophysiologist:  None   Chief Complaint  Patient presents with   Palpitations     History of Present Illness: Elizabeth Mcclure is a 55 y.o. female referred for cardiology consultation by Dr. Woody Seller for the evaluation of shortness of breath per record review.  She tells me that she has actually been more concerned about a sense of rapid heartbeat/palpitations.  This has been occurring sporadically and also with emotional stress and anxiety over the last 5 or 6 months.  She has been having symptoms daily most recently.  No associated syncope.  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  She underwent a Lexiscan Myoview at [facility] back in July that was normal as outlined below, reassuring.  I personally reviewed her ECG today which shows normal sinus rhythm.  She is also frustrated that she has gained weight, has not been as active.  She had lost 170 pounds previously with weight watchers.  I reviewed her medications which are noted below.  Past Medical History:  Diagnosis Date   Anxiety    Arthritis    Carpal tunnel syndrome    Constipation    Depression    GERD (gastroesophageal reflux disease)    History of pneumonia    Osteoarthritis    Varicose veins of right leg with edema    pt states has had vein closure twice in this leg saw Dr Early 5 years ago pt states has not seen since edema decreases with elevation     Past Surgical History:  Procedure Laterality Date   ablation right great sapheous vein      02/09/2008   ANTERIOR CERVICAL DECOMP/DISCECTOMY FUSION N/A 11/12/2018   Procedure: ANTERIOR CERVICAL DECOMPRESSION/DISCECTOMY FUSION CERVICAL FIVE- CERVICAL SIX, CERVICAL SIX-  CERVICAL SEVEN;  Surgeon: Earnie Larsson, MD;  Location: [facility];  Service: Neurosurgery;  Laterality: N/A;  ANTERIOR CERVICAL DECOMPRESSION/DISCECTOMY FUSION CERVICAL FIVE- CERVICAL SIX, CERVICAL SIX- CERVICAL SEVEN   CHOLECYSTECTOMY     COLONOSCOPY  05/24/2012   Procedure: COLONOSCOPY;  Surgeon: Danie Binder, MD;  Location: [facility];  Service: Endoscopy;  Laterality: N/A;  9:30   DILATION AND CURETTAGE OF UTERUS     JOINT REPLACEMENT     left knee arthroscopy     stab phlebectomy      02/2008   TOTAL KNEE ARTHROPLASTY Left 01/18/2015   Procedure: LEFT TOTAL KNEE ARTHROPLASTY;  Surgeon: Rod Can, MD;  Location: [facility];  Service: Orthopedics;  Laterality: Left;   vein closure     X2, needs vein stripping   VEIN LIGATION AND STRIPPING Right 07/25/2016   Procedure: LIGATION AND STRIPPING GREATER SAPHENOUS VEIN; 10-20 STAB PHLEBECTOMY;  Surgeon: Rosetta Posner, MD;  Location: [facility] OR;  Service: Vascular;  Laterality: Right;   venous duplex      hx of     Current Outpatient Medications  Medication Sig Dispense Refill   ALPRAZolam (XANAX) 0.25 MG tablet Take 0.25 mg by mouth daily as needed for anxiety.      cetirizine (ZYRTEC) 10 MG tablet Take 10 mg by mouth daily.     cyclobenzaprine (FLEXERIL) 10 MG tablet Take 1 tablet (10 mg total) by  mouth 3 (three) times daily as needed for muscle spasms. (Patient not taking: Reported on 05/15/2021) 30 tablet 0   docusate sodium (COLACE) 100 MG capsule Take 100 mg by mouth at bedtime. (Patient not taking: Reported on 05/15/2021)     HYDROcodone-acetaminophen (NORCO/VICODIN) 5-325 MG tablet Take 1 tablet by mouth every 4 (four) hours as needed for moderate pain ((score 4 to 6)). (Patient not taking: Reported on 05/15/2021) 30 tablet 0   oxyCODONE-acetaminophen (PERCOCET/ROXICET) 5-325 MG tablet Take 1-2 tablets by mouth every 4 (four) hours as needed (pain).  (Patient not taking: Reported on 05/15/2021)     No current facility-administered medications for  this visit.   Allergies:  Benadryl [diphenhydramine] and Penicillins   Social History: The patient  reports that she has quit smoking. Her smoking use included cigarettes. She has a 0.75 pack-year smoking history. She has never used smokeless tobacco. She reports current alcohol use. She reports that she does not use drugs.   Family History: The patient's family history includes Arthritis in her father; Cancer in her sister; Colon cancer in her mother; Heart disease in her brother, father, and sister; Hypertension in her father and mother.   ROS: No orthopnea or PND.  Physical Exam: VS:  BP 134/84   Pulse 69   Ht 5\' 7"  (1.702 m)   Wt (!) 342 lb 6.4 oz (155.3 kg)   LMP 10/06/2018   SpO2 98%   BMI 53.63 kg/m , BMI Body mass index is 53.63 kg/m.  Wt Readings from Last 3 Encounters:  05/15/21 (!) 342 lb 6.4 oz (155.3 kg)  11/12/18 (!) 302 lb (137 kg)  11/11/18 (!) 302 lb (137 kg)    General: Patient appears comfortable at rest. HEENT: Conjunctiva and lids normal, wearing a mask. Neck: Supple, no elevated JVP or carotid bruits, no thyromegaly. Lungs: Clear to auscultation, nonlabored breathing at rest. Cardiac: Regular rate and rhythm, no S3 or significant systolic murmur, no pericardial rub. Abdomen: Bowel sounds present. Extremities: No pitting edema. Skin: Warm and dry. Musculoskeletal: No kyphosis. Neuropsychiatric: Alert and oriented x3, affect grossly appropriate.  ECG:  An ECG dated 01/11/2021 was personally reviewed today and demonstrated:  Sinus rhythm with decreased R wave progression and low voltage.  Recent Labwork:  No recent lab work available for review today.  Other Studies Reviewed Today:  Lexiscan Myoview 01/29/2021 [facility]): 1. No significant ischemia is noted on the perfusion imaging..   2. Normal left ventricular wall motion.   3. Left ventricular ejection fraction 61%   4. Non invasive risk stratification*: Low   Assessment and Plan:  1.   Intermittent palpitations as discussed above.  Baseline ECG is normal today.  Could be related to recent stress and anxiety, however also with sporadic symptoms need to exclude paroxysmal arrhythmia.  We will obtain a 7-day Zio patch.  2.  Shortness of breath.  Patient admits that she has been much less active and has gained a significant amount of weight over time.  Plans to ultimately get back to weight watchers.  She underwent a Lexiscan Myoview in July at [facility] that was reassuring.  We will obtain an echocardiogram to ensure structurally normal heart.  Medication Adjustments/Labs and Tests Ordered: Current medicines are reviewed at length with the patient today.  Concerns regarding medicines are outlined above.   Tests Ordered: Orders Placed This Encounter  Procedures   EKG 12-Lead   ECHOCARDIOGRAM COMPLETE     Medication Changes: No orders of the defined  types were placed in this encounter.   Disposition:  Follow up  test results.  Signed, Satira Sark, MD, [facility] 05/15/2021 11:24 AM    [facility] [place], [place], [place] [address] Phone: [phone]; Fax: [phone]

## 2021-05-15 ENCOUNTER — Other Ambulatory Visit: Payer: Self-pay | Admitting: Cardiology

## 2021-05-15 ENCOUNTER — Encounter: Payer: Self-pay | Admitting: Cardiology

## 2021-05-15 ENCOUNTER — Ambulatory Visit (INDEPENDENT_AMBULATORY_CARE_PROVIDER_SITE_OTHER): Payer: Medicare PPO

## 2021-05-15 ENCOUNTER — Ambulatory Visit: Payer: Medicare PPO | Admitting: Cardiology

## 2021-05-15 VITALS — BP 134/84 | HR 69 | Ht 67.0 in | Wt 342.4 lb

## 2021-05-15 DIAGNOSIS — R002 Palpitations: Secondary | ICD-10-CM | POA: Diagnosis not present

## 2021-05-15 DIAGNOSIS — R0602 Shortness of breath: Secondary | ICD-10-CM | POA: Diagnosis not present

## 2021-05-15 NOTE — Patient Instructions (Addendum)
Medication Instructions:  Your physician recommends that you continue on your current medications as directed. Please refer to the Current Medication list given to you today.  Labwork: none  Testing/Procedures: Your physician has requested that you have an echocardiogram. Echocardiography is a painless test that uses sound waves to create images of your heart. It provides your doctor with information about the size and shape of your heart and how well your heart's chambers and valves are working. This procedure takes approximately one hour. There are no restrictions for this procedure. ZIO- Long Term Monitor Instructions   Your physician has requested you wear your ZIO patch monitor 7 days.   This is a single patch monitor.  [employer] supplies one patch monitor per enrollment.  Additional stickers are not available.   Please do not apply patch if you will be having a Nuclear Stress Test, Echocardiogram, Cardiac CT, MRI, or Chest Xray during the time frame you would be wearing the monitor. The patch cannot be worn during these tests.  You cannot remove and re-apply the ZIO XT patch monitor.     Once you have received you monitor, please review enclosed instructions.  Your monitor has already been registered assigning a specific monitor serial # to you.   Applying the monitor   Shave hair from upper left chest.   Hold abrader disc by orange tab.  Rub abrader in 40 strokes over left upper chest as indicated in your monitor instructions.   Clean area with 4 enclosed alcohol pads .  Use all pads to assure are is cleaned thoroughly.  Let dry.   Apply patch as indicated in monitor instructions.  Patch will be place under collarbone on left side of chest with arrow pointing upward.   Rub patch adhesive wings for 2 minutes.Remove white label marked "1".  Remove white label marked "2".  Rub patch adhesive wings for 2 additional minutes.   While looking in a mirror, press and release button in  center of patch.  A small green light will flash 3-4 times .  This will be your only indicator the monitor has been turned on.     Do not shower for the first 24 hours.  You may shower after the first 24 hours.   Press button if you feel a symptom. You will hear a small click.  Record Date, Time and Symptom in the Patient Log Book.   When you are ready to remove patch, follow instructions on last 2 pages of Patient Log Book.  Stick patch monitor onto last page of Patient Log Book.   Place Patient Log Book in Blue box.  Use locking tab on box and tape box closed securely.  The Orange and White box has prepaid postage on it.  Please place in mailbox as soon as possible.  Your physician should have your test results approximately 7 days after the monitor has been mailed back to [employer].   Call [employer] Customer Care at [phone] if you have questions regarding your ZIO XT patch monitor.  Call them immediately if you see an orange light blinking on your monitor.   If your monitor falls off in less than 4 days contact our Monitor department at [phone].  If your monitor becomes loose or falls off after 4 days call [employer] at [phone] for suggestions on securing your monitor.  Follow-Up: Your physician recommends that you schedule a follow-up appointment in: pending  Any Other Special Instructions Will Be Listed Below (If Applicable).    If you need a refill on your cardiac medications before your next appointment, please call your pharmacy. 

## 2021-05-27 DIAGNOSIS — R002 Palpitations: Secondary | ICD-10-CM | POA: Diagnosis not present

## 2021-05-28 ENCOUNTER — Telehealth: Payer: Self-pay | Admitting: *Deleted

## 2021-05-28 NOTE — Telephone Encounter (Signed)
Patient informed. Copy sent to PCP Says she will call if she decides she wants to try toprol xl 12.5 mg

## 2021-05-28 NOTE — Telephone Encounter (Signed)
-----   Message from Satira Sark, MD sent at 05/28/2021 12:37 PM EST ----- Results reviewed.  Overall heart rhythm looks good.  She did have several episodes of brief PSVT which she is probably sensing as episodic fast heartbeat.  These are not dangerous to her, but if she is still experiencing uncomfortable symptoms, could consider starting Toprol-XL 12.5 mg daily.  Await echocardiogram results.

## 2021-06-20 ENCOUNTER — Ambulatory Visit (INDEPENDENT_AMBULATORY_CARE_PROVIDER_SITE_OTHER): Payer: Medicare PPO

## 2021-06-20 DIAGNOSIS — R0602 Shortness of breath: Secondary | ICD-10-CM | POA: Diagnosis not present

## 2021-06-20 LAB — ECHOCARDIOGRAM COMPLETE
AR max vel: 3.03 cm2
AV Area VTI: 3.32 cm2
AV Area mean vel: 3.37 cm2
AV Mean grad: 3.6 mmHg
AV Peak grad: 7.3 mmHg
Ao pk vel: 1.35 m/s
Area-P 1/2: 3.68 cm2
S' Lateral: 3.38 cm

## 2021-06-21 ENCOUNTER — Telehealth: Payer: Self-pay | Admitting: *Deleted

## 2021-06-21 NOTE — Telephone Encounter (Signed)
Patient informed. Copy sent to PCP °

## 2021-06-21 NOTE — Telephone Encounter (Signed)
-----   Message from Satira Sark, MD sent at 06/20/2021  5:01 PM EST ----- Results reviewed.  LVEF is normal at 60 to 65%, also normal diastolic parameters and RV contraction.

## 2021-06-27 ENCOUNTER — Ambulatory Visit (HOSPITAL_COMMUNITY): Payer: Medicare PPO

## 2021-11-05 DIAGNOSIS — Z1231 Encounter for screening mammogram for malignant neoplasm of breast: Secondary | ICD-10-CM | POA: Diagnosis not present

## 2021-12-26 DIAGNOSIS — Z789 Other specified health status: Secondary | ICD-10-CM | POA: Diagnosis not present

## 2021-12-26 DIAGNOSIS — R42 Dizziness and giddiness: Secondary | ICD-10-CM | POA: Diagnosis not present

## 2021-12-26 DIAGNOSIS — Z299 Encounter for prophylactic measures, unspecified: Secondary | ICD-10-CM | POA: Diagnosis not present

## 2021-12-26 DIAGNOSIS — E161 Other hypoglycemia: Secondary | ICD-10-CM | POA: Diagnosis not present

## 2021-12-27 DIAGNOSIS — E161 Other hypoglycemia: Secondary | ICD-10-CM | POA: Diagnosis not present

## 2022-03-27 DIAGNOSIS — Z299 Encounter for prophylactic measures, unspecified: Secondary | ICD-10-CM | POA: Diagnosis not present

## 2022-03-27 DIAGNOSIS — E7800 Pure hypercholesterolemia, unspecified: Secondary | ICD-10-CM | POA: Diagnosis not present

## 2022-03-27 DIAGNOSIS — Z1339 Encounter for screening examination for other mental health and behavioral disorders: Secondary | ICD-10-CM | POA: Diagnosis not present

## 2022-03-27 DIAGNOSIS — Z1331 Encounter for screening for depression: Secondary | ICD-10-CM | POA: Diagnosis not present

## 2022-03-27 DIAGNOSIS — Z6841 Body Mass Index (BMI) 40.0 and over, adult: Secondary | ICD-10-CM | POA: Diagnosis not present

## 2022-03-27 DIAGNOSIS — R5383 Other fatigue: Secondary | ICD-10-CM | POA: Diagnosis not present

## 2022-03-27 DIAGNOSIS — Z7189 Other specified counseling: Secondary | ICD-10-CM | POA: Diagnosis not present

## 2022-03-27 DIAGNOSIS — Z79899 Other long term (current) drug therapy: Secondary | ICD-10-CM | POA: Diagnosis not present

## 2022-03-27 DIAGNOSIS — Z0000 Encounter for general adult medical examination without abnormal findings: Secondary | ICD-10-CM | POA: Diagnosis not present

## 2022-04-12 DIAGNOSIS — Z1211 Encounter for screening for malignant neoplasm of colon: Secondary | ICD-10-CM | POA: Diagnosis not present

## 2022-04-12 DIAGNOSIS — Z1212 Encounter for screening for malignant neoplasm of rectum: Secondary | ICD-10-CM | POA: Diagnosis not present

## 2022-04-19 LAB — COLOGUARD: COLOGUARD: NEGATIVE

## 2022-04-22 DIAGNOSIS — Z2821 Immunization not carried out because of patient refusal: Secondary | ICD-10-CM | POA: Diagnosis not present

## 2022-04-22 DIAGNOSIS — Z6841 Body Mass Index (BMI) 40.0 and over, adult: Secondary | ICD-10-CM | POA: Diagnosis not present

## 2022-04-22 DIAGNOSIS — Z299 Encounter for prophylactic measures, unspecified: Secondary | ICD-10-CM | POA: Diagnosis not present

## 2022-04-22 DIAGNOSIS — Z0000 Encounter for general adult medical examination without abnormal findings: Secondary | ICD-10-CM | POA: Diagnosis not present

## 2022-04-22 DIAGNOSIS — I739 Peripheral vascular disease, unspecified: Secondary | ICD-10-CM | POA: Diagnosis not present

## 2022-04-22 DIAGNOSIS — Z789 Other specified health status: Secondary | ICD-10-CM | POA: Diagnosis not present

## 2022-05-05 DIAGNOSIS — Z01419 Encounter for gynecological examination (general) (routine) without abnormal findings: Secondary | ICD-10-CM | POA: Diagnosis not present

## 2022-05-05 DIAGNOSIS — M5416 Radiculopathy, lumbar region: Secondary | ICD-10-CM | POA: Insufficient documentation

## 2022-05-06 ENCOUNTER — Encounter: Payer: Self-pay | Admitting: *Deleted

## 2022-05-08 ENCOUNTER — Ambulatory Visit: Payer: Medicare PPO | Admitting: Podiatry

## 2022-05-08 ENCOUNTER — Ambulatory Visit (INDEPENDENT_AMBULATORY_CARE_PROVIDER_SITE_OTHER): Payer: Medicare PPO

## 2022-05-08 DIAGNOSIS — L84 Corns and callosities: Secondary | ICD-10-CM | POA: Diagnosis not present

## 2022-05-08 DIAGNOSIS — M778 Other enthesopathies, not elsewhere classified: Secondary | ICD-10-CM

## 2022-05-08 DIAGNOSIS — M722 Plantar fascial fibromatosis: Secondary | ICD-10-CM

## 2022-05-08 DIAGNOSIS — M205X2 Other deformities of toe(s) (acquired), left foot: Secondary | ICD-10-CM

## 2022-05-08 NOTE — Patient Instructions (Signed)

## 2022-05-08 NOTE — Progress Notes (Signed)
  Subjective:  Patient ID: Elizabeth Mcclure, female    DOB: 03-11-1966,  MRN: [id number]  Chief Complaint  Patient presents with   Foot Problem    BIL BONE SPURS- Bilateral heels. / 3RD, 4TH & 5TH TOES OF LEFT FOOT HAVE TURNED OUTWARD    56 y.o. female presents with the above complaint.  Patient presents for concern with pain in the bilateral heel as well as left fifth toe pain.  She has a history of having plantar fasciitis.  She has had steroid injections many years in the past for the left and right heel.  She notes that it did help improve her pain.  She does not wear inserts currently.  Arch she also has pain with regards to the left fifth toe which is curving under the fourth toe and putting pressure on the fourth toe.   Review of Systems: Negative except as noted in the HPI. Denies N/V/F/Ch.   Objective:  There were no vitals filed for this visit. There is no height or weight on file to calculate BMI. Constitutional Well developed. Well nourished.  Vascular Dorsalis pedis pulses palpable bilaterally. Posterior tibial pulses palpable bilaterally. Capillary refill normal to all digits.  No cyanosis or clubbing noted. Pedal hair growth normal.  Neurologic Normal speech. Oriented to person, place, and time. Epicritic sensation to light touch grossly present bilaterally.  Dermatologic Nails well groomed and normal in appearance. No open wounds. No skin lesions.  Orthopedic: Normal joint ROM without pain or crepitus bilaterally. No visible deformities. Tender to palpation at the calcaneal tuber left. Tender to palpation left fifth toe at the distal interphalangeal joint where there is noted to be callus formation.  Adductovarus rotation of the left fifth toe left fifth toe abuts the fourth toe lateral border. No pain with calcaneal squeeze left. Ankle ROM diminished range of motion left. Silfverskiold Test: negative left.   Radiographs: Taken and reviewed. No acute fractures or  dislocations. No evidence of stress fracture.  Plantar heel spur present. Posterior heel spur absent.  Adductovarus rotation of the fifth toe on the left foot  Assessment:   1. Plantar fasciitis, left   2. Adductovarus rotation of toe, acquired, left   3. Corn of toe   4. Capsulitis of foot    Plan:  Patient was evaluated and treated and all questions answered.  Plantar Fasciitis, left - XR reviewed as above.  - Educated on icing and stretching. Instructions given.  - Injection delivered to the plantar fascia as below. - DME: Night splint dispensed. - Pharmacologic management: Ibuprofen as needed.  Procedure: Injection Tendon/Ligament Location: Left plantar fascia at the glabrous junction; medial approach. Skin Prep: alcohol Injectate: 1 cc 0.5% marcaine plain, 1 cc kenalog 10. Disposition: Patient tolerated procedure well. Injection site dressed with a band-aid.  #Left fifth toe pain and adductovarus fifth toe deformity -Recommend gel toe cap for pain control at the time being.  This was dispensed to the patient at this visit. -Discussed if this fails we will have to consider surgical intervention which would include fifth toe derotational arthroplasty of the proximal interphalangeal joint to decrease rotation of the toe as well as prevent the toe from pressing against the fourth toe proximal phalanx.  Return in about 6 weeks (around 06/19/2022) for Left heel pain and 5th toe pain.

## 2022-05-13 DIAGNOSIS — N841 Polyp of cervix uteri: Secondary | ICD-10-CM | POA: Diagnosis not present

## 2022-06-19 ENCOUNTER — Ambulatory Visit: Payer: Medicare PPO | Admitting: Podiatry

## 2022-07-17 DIAGNOSIS — Z6841 Body Mass Index (BMI) 40.0 and over, adult: Secondary | ICD-10-CM | POA: Diagnosis not present

## 2022-07-17 DIAGNOSIS — Z981 Arthrodesis status: Secondary | ICD-10-CM | POA: Diagnosis not present

## 2022-07-17 DIAGNOSIS — M47812 Spondylosis without myelopathy or radiculopathy, cervical region: Secondary | ICD-10-CM | POA: Diagnosis not present

## 2022-07-17 DIAGNOSIS — M79641 Pain in right hand: Secondary | ICD-10-CM | POA: Diagnosis not present

## 2022-08-07 DIAGNOSIS — R7303 Prediabetes: Secondary | ICD-10-CM | POA: Diagnosis not present

## 2022-08-07 DIAGNOSIS — I739 Peripheral vascular disease, unspecified: Secondary | ICD-10-CM | POA: Diagnosis not present

## 2022-08-07 DIAGNOSIS — Z299 Encounter for prophylactic measures, unspecified: Secondary | ICD-10-CM | POA: Diagnosis not present

## 2022-08-07 DIAGNOSIS — Z6841 Body Mass Index (BMI) 40.0 and over, adult: Secondary | ICD-10-CM | POA: Diagnosis not present

## 2022-08-07 DIAGNOSIS — F339 Major depressive disorder, recurrent, unspecified: Secondary | ICD-10-CM | POA: Diagnosis not present

## 2022-08-07 DIAGNOSIS — R42 Dizziness and giddiness: Secondary | ICD-10-CM | POA: Diagnosis not present

## 2022-08-11 DIAGNOSIS — G5603 Carpal tunnel syndrome, bilateral upper limbs: Secondary | ICD-10-CM | POA: Diagnosis not present

## 2022-08-14 DIAGNOSIS — Z6841 Body Mass Index (BMI) 40.0 and over, adult: Secondary | ICD-10-CM | POA: Diagnosis not present

## 2022-08-14 DIAGNOSIS — Z299 Encounter for prophylactic measures, unspecified: Secondary | ICD-10-CM | POA: Diagnosis not present

## 2022-08-14 DIAGNOSIS — I739 Peripheral vascular disease, unspecified: Secondary | ICD-10-CM | POA: Diagnosis not present

## 2022-08-28 DIAGNOSIS — G5603 Carpal tunnel syndrome, bilateral upper limbs: Secondary | ICD-10-CM | POA: Diagnosis not present

## 2022-08-28 DIAGNOSIS — Z6841 Body Mass Index (BMI) 40.0 and over, adult: Secondary | ICD-10-CM | POA: Diagnosis not present

## 2022-09-04 DIAGNOSIS — G5601 Carpal tunnel syndrome, right upper limb: Secondary | ICD-10-CM | POA: Diagnosis not present

## 2022-09-08 DIAGNOSIS — R110 Nausea: Secondary | ICD-10-CM | POA: Diagnosis not present

## 2022-09-08 DIAGNOSIS — I739 Peripheral vascular disease, unspecified: Secondary | ICD-10-CM | POA: Diagnosis not present

## 2022-09-08 DIAGNOSIS — J069 Acute upper respiratory infection, unspecified: Secondary | ICD-10-CM | POA: Diagnosis not present

## 2022-11-06 DIAGNOSIS — Z13820 Encounter for screening for osteoporosis: Secondary | ICD-10-CM | POA: Diagnosis not present

## 2022-11-06 DIAGNOSIS — Z780 Asymptomatic menopausal state: Secondary | ICD-10-CM | POA: Diagnosis not present

## 2022-11-10 DIAGNOSIS — Z1231 Encounter for screening mammogram for malignant neoplasm of breast: Secondary | ICD-10-CM | POA: Diagnosis not present

## 2022-12-09 DIAGNOSIS — R21 Rash and other nonspecific skin eruption: Secondary | ICD-10-CM | POA: Diagnosis not present

## 2022-12-09 DIAGNOSIS — Z299 Encounter for prophylactic measures, unspecified: Secondary | ICD-10-CM | POA: Diagnosis not present

## 2022-12-09 DIAGNOSIS — K219 Gastro-esophageal reflux disease without esophagitis: Secondary | ICD-10-CM | POA: Diagnosis not present

## 2022-12-09 DIAGNOSIS — W57XXXA Bitten or stung by nonvenomous insect and other nonvenomous arthropods, initial encounter: Secondary | ICD-10-CM | POA: Diagnosis not present

## 2022-12-24 DIAGNOSIS — G5603 Carpal tunnel syndrome, bilateral upper limbs: Secondary | ICD-10-CM | POA: Diagnosis not present

## 2022-12-30 ENCOUNTER — Other Ambulatory Visit: Payer: Self-pay

## 2022-12-30 DIAGNOSIS — M5410 Radiculopathy, site unspecified: Secondary | ICD-10-CM

## 2022-12-31 ENCOUNTER — Other Ambulatory Visit: Payer: Self-pay | Admitting: Neurosurgery

## 2022-12-31 DIAGNOSIS — M5410 Radiculopathy, site unspecified: Secondary | ICD-10-CM

## 2023-01-07 ENCOUNTER — Ambulatory Visit
Admission: RE | Admit: 2023-01-07 | Discharge: 2023-01-07 | Disposition: A | Discharge status: Home / Self Care | Payer: Medicare PPO | Source: Ambulatory Visit | Attending: Neurosurgery | Admitting: Neurosurgery

## 2023-01-07 DIAGNOSIS — M47816 Spondylosis without myelopathy or radiculopathy, lumbar region: Secondary | ICD-10-CM | POA: Diagnosis not present

## 2023-01-07 DIAGNOSIS — M5410 Radiculopathy, site unspecified: Secondary | ICD-10-CM

## 2023-01-07 DIAGNOSIS — M48061 Spinal stenosis, lumbar region without neurogenic claudication: Secondary | ICD-10-CM | POA: Diagnosis not present

## 2023-01-21 DIAGNOSIS — M5416 Radiculopathy, lumbar region: Secondary | ICD-10-CM | POA: Diagnosis not present

## 2023-01-21 DIAGNOSIS — Z6841 Body Mass Index (BMI) 40.0 and over, adult: Secondary | ICD-10-CM | POA: Diagnosis not present

## 2023-02-02 DIAGNOSIS — M5416 Radiculopathy, lumbar region: Secondary | ICD-10-CM | POA: Diagnosis not present

## 2023-03-04 DIAGNOSIS — Z6841 Body Mass Index (BMI) 40.0 and over, adult: Secondary | ICD-10-CM | POA: Diagnosis not present

## 2023-03-04 DIAGNOSIS — M4316 Spondylolisthesis, lumbar region: Secondary | ICD-10-CM | POA: Diagnosis not present

## 2023-03-04 DIAGNOSIS — M4310 Spondylolisthesis, site unspecified: Secondary | ICD-10-CM | POA: Diagnosis not present

## 2023-03-18 DIAGNOSIS — M5416 Radiculopathy, lumbar region: Secondary | ICD-10-CM | POA: Diagnosis not present

## 2023-04-01 DIAGNOSIS — Z299 Encounter for prophylactic measures, unspecified: Secondary | ICD-10-CM | POA: Diagnosis not present

## 2023-04-01 DIAGNOSIS — E7800 Pure hypercholesterolemia, unspecified: Secondary | ICD-10-CM | POA: Diagnosis not present

## 2023-04-01 DIAGNOSIS — Z1339 Encounter for screening examination for other mental health and behavioral disorders: Secondary | ICD-10-CM | POA: Diagnosis not present

## 2023-04-01 DIAGNOSIS — R5383 Other fatigue: Secondary | ICD-10-CM | POA: Diagnosis not present

## 2023-04-01 DIAGNOSIS — R232 Flushing: Secondary | ICD-10-CM | POA: Diagnosis not present

## 2023-04-01 DIAGNOSIS — Z0000 Encounter for general adult medical examination without abnormal findings: Secondary | ICD-10-CM | POA: Diagnosis not present

## 2023-04-01 DIAGNOSIS — Z79899 Other long term (current) drug therapy: Secondary | ICD-10-CM | POA: Diagnosis not present

## 2023-04-01 DIAGNOSIS — Z2821 Immunization not carried out because of patient refusal: Secondary | ICD-10-CM | POA: Diagnosis not present

## 2023-04-01 DIAGNOSIS — Z7189 Other specified counseling: Secondary | ICD-10-CM | POA: Diagnosis not present

## 2023-04-01 DIAGNOSIS — Z1331 Encounter for screening for depression: Secondary | ICD-10-CM | POA: Diagnosis not present

## 2023-04-21 DIAGNOSIS — M5416 Radiculopathy, lumbar region: Secondary | ICD-10-CM | POA: Diagnosis not present

## 2023-04-21 DIAGNOSIS — Z6841 Body Mass Index (BMI) 40.0 and over, adult: Secondary | ICD-10-CM | POA: Diagnosis not present

## 2023-04-21 DIAGNOSIS — M4316 Spondylolisthesis, lumbar region: Secondary | ICD-10-CM | POA: Diagnosis not present

## 2023-04-28 DIAGNOSIS — F339 Major depressive disorder, recurrent, unspecified: Secondary | ICD-10-CM | POA: Diagnosis not present

## 2023-04-28 DIAGNOSIS — Z0000 Encounter for general adult medical examination without abnormal findings: Secondary | ICD-10-CM | POA: Diagnosis not present

## 2023-04-28 DIAGNOSIS — Z299 Encounter for prophylactic measures, unspecified: Secondary | ICD-10-CM | POA: Diagnosis not present

## 2023-04-28 DIAGNOSIS — I739 Peripheral vascular disease, unspecified: Secondary | ICD-10-CM | POA: Diagnosis not present

## 2023-05-01 ENCOUNTER — Encounter: Payer: Self-pay | Admitting: Podiatry

## 2023-05-01 ENCOUNTER — Ambulatory Visit (INDEPENDENT_AMBULATORY_CARE_PROVIDER_SITE_OTHER): Payer: Medicare PPO

## 2023-05-01 ENCOUNTER — Ambulatory Visit: Payer: Medicare PPO | Admitting: Podiatry

## 2023-05-01 VITALS — Ht 67.0 in | Wt 342.0 lb

## 2023-05-01 DIAGNOSIS — M722 Plantar fascial fibromatosis: Secondary | ICD-10-CM

## 2023-05-01 DIAGNOSIS — M778 Other enthesopathies, not elsewhere classified: Secondary | ICD-10-CM

## 2023-05-01 DIAGNOSIS — M2042 Other hammer toe(s) (acquired), left foot: Secondary | ICD-10-CM | POA: Diagnosis not present

## 2023-05-01 DIAGNOSIS — D169 Benign neoplasm of bone and articular cartilage, unspecified: Secondary | ICD-10-CM

## 2023-05-01 NOTE — Progress Notes (Signed)
Subjective:   Patient ID: Elizabeth Mcclure, female   DOB: 57 y.o.   MRN: [id number]   HPI Patient presents with severe pain in the heel region both feet and has a painful fifth digit left with a lesion on the inside of the toe chronic it has been going on a long time and is impossible for her to take care of   ROS      Objective:  Physical Exam  Neurovascular status intact exquisite discomfort of the medial fascial band bilateral heel with rotated fifth digit causing problems on the outside of the toe and keratotic lesion inside fifth digit very painful when pressed making shoe gear difficult with obesity is complicating factor     Assessment:  Hammertoe deformity exostosis and chronic Planter fasciitis with acute nature      Plan:  H&P x-rays reviewed and I went ahead today I did sterile prep I injected the plantar fascia bilateral 3 mg Kenalog 5 mg Xylocaine.  I then discussed the hammertoe exostosis and I have recommended distal derotational arthroplasty with distal medial exostectomy to reduce the chronic problem and pain.  She has had numerous conservative treatments with debridements paddings wider shoes without relief  I allowed her to read consent form going over alternative treatments complications and after review she signed consent form and is scheduled for outpatient surgery.  All questions answered understands the recovery will take several months but she can walk on it right away but I want to keep it elevated the first week  X-rays taken today indicated significant rotation digit 5 left over right with bone spur formation plantar heel no indication stress fracture arthritis

## 2023-05-21 DIAGNOSIS — R635 Abnormal weight gain: Secondary | ICD-10-CM | POA: Diagnosis not present

## 2023-05-21 DIAGNOSIS — Z299 Encounter for prophylactic measures, unspecified: Secondary | ICD-10-CM | POA: Diagnosis not present

## 2023-05-21 DIAGNOSIS — Z6841 Body Mass Index (BMI) 40.0 and over, adult: Secondary | ICD-10-CM | POA: Diagnosis not present

## 2023-05-21 DIAGNOSIS — I739 Peripheral vascular disease, unspecified: Secondary | ICD-10-CM | POA: Diagnosis not present

## 2023-06-18 DIAGNOSIS — F339 Major depressive disorder, recurrent, unspecified: Secondary | ICD-10-CM | POA: Diagnosis not present

## 2023-06-18 DIAGNOSIS — Z299 Encounter for prophylactic measures, unspecified: Secondary | ICD-10-CM | POA: Diagnosis not present

## 2023-06-18 DIAGNOSIS — Z6841 Body Mass Index (BMI) 40.0 and over, adult: Secondary | ICD-10-CM | POA: Diagnosis not present

## 2023-06-18 DIAGNOSIS — I739 Peripheral vascular disease, unspecified: Secondary | ICD-10-CM | POA: Diagnosis not present

## 2023-06-18 DIAGNOSIS — R635 Abnormal weight gain: Secondary | ICD-10-CM | POA: Diagnosis not present

## 2023-07-14 DIAGNOSIS — R635 Abnormal weight gain: Secondary | ICD-10-CM | POA: Diagnosis not present

## 2023-07-14 DIAGNOSIS — I739 Peripheral vascular disease, unspecified: Secondary | ICD-10-CM | POA: Diagnosis not present

## 2023-07-14 DIAGNOSIS — Z6841 Body Mass Index (BMI) 40.0 and over, adult: Secondary | ICD-10-CM | POA: Diagnosis not present

## 2023-07-14 DIAGNOSIS — F339 Major depressive disorder, recurrent, unspecified: Secondary | ICD-10-CM | POA: Diagnosis not present

## 2023-07-14 DIAGNOSIS — Z299 Encounter for prophylactic measures, unspecified: Secondary | ICD-10-CM | POA: Diagnosis not present

## 2023-08-18 DIAGNOSIS — Z6837 Body mass index (BMI) 37.0-37.9, adult: Secondary | ICD-10-CM | POA: Diagnosis not present

## 2023-08-18 DIAGNOSIS — Z299 Encounter for prophylactic measures, unspecified: Secondary | ICD-10-CM | POA: Diagnosis not present

## 2023-08-18 DIAGNOSIS — R0981 Nasal congestion: Secondary | ICD-10-CM | POA: Diagnosis not present

## 2023-08-18 DIAGNOSIS — R0602 Shortness of breath: Secondary | ICD-10-CM | POA: Diagnosis not present

## 2023-08-21 ENCOUNTER — Telehealth: Payer: Self-pay | Admitting: Cardiology

## 2023-08-21 NOTE — Telephone Encounter (Signed)
Patient is not experiencing active chest pain.  Has been occurring off/on with some sob.  Patient last seen if office 05/15/2021.  Appointment given for Friday, 09/25/2023 at 2:30 with Sharlene Dory, NP in [place] office.  Suggested that she be seen at urgent care or ED in the meantime if symptoms persist / worsen.  She verbalized understanding.

## 2023-08-21 NOTE — Telephone Encounter (Signed)
Pt called in stating she has been having chest tightness and SOB (nothing currently). She states its been going on for a month off and on. She does not take any nitroglycerin. Please advise.     Pt c/o of Chest Pain: STAT if active CP, including tightness, pressure, jaw pain, radiating pain to shoulder/upper arm/back, CP unrelieved by Nitro. Symptoms reported of SOB, nausea, vomiting, sweating.  1. Are you having CP right now? No     2. Are you experiencing any other symptoms (ex. SOB, nausea, vomiting, sweating)? no   3. Is your CP continuous or coming and going? Coming and going    4. Have you taken Nitroglycerin? No    5. How long have you been experiencing CP? Month     6. If NO CP at time of call then end call with telling Pt to call back or call 911 if Chest pain returns prior to return call from triage team.

## 2023-09-25 ENCOUNTER — Encounter: Payer: Self-pay | Admitting: Nurse Practitioner

## 2023-09-25 ENCOUNTER — Ambulatory Visit: Payer: Medicare PPO | Attending: Nurse Practitioner | Admitting: Nurse Practitioner

## 2023-09-25 ENCOUNTER — Encounter: Payer: Self-pay | Admitting: *Deleted

## 2023-09-25 VITALS — BP 130/84 | HR 74 | Ht 68.0 in | Wt 336.6 lb

## 2023-09-25 DIAGNOSIS — Z79899 Other long term (current) drug therapy: Secondary | ICD-10-CM

## 2023-09-25 DIAGNOSIS — R0602 Shortness of breath: Secondary | ICD-10-CM | POA: Diagnosis not present

## 2023-09-25 DIAGNOSIS — R079 Chest pain, unspecified: Secondary | ICD-10-CM

## 2023-09-25 DIAGNOSIS — E785 Hyperlipidemia, unspecified: Secondary | ICD-10-CM

## 2023-09-25 DIAGNOSIS — R7309 Other abnormal glucose: Secondary | ICD-10-CM

## 2023-09-25 DIAGNOSIS — Z131 Encounter for screening for diabetes mellitus: Secondary | ICD-10-CM

## 2023-09-25 DIAGNOSIS — R42 Dizziness and giddiness: Secondary | ICD-10-CM

## 2023-09-25 MED ORDER — METOPROLOL TARTRATE 100 MG PO TABS: 100.0000 mg | ORAL_TABLET | Freq: Once | ORAL | 0 refills | Status: DC

## 2023-09-25 MED ORDER — NITROGLYCERIN 0.4 MG SL SUBL: 0.4000 mg | SUBLINGUAL_TABLET | SUBLINGUAL | 3 refills | Status: DC | PRN

## 2023-09-25 NOTE — Patient Instructions (Addendum)
 Medication Instructions:   Refilled Nitroglycerin today  Continue all other medications.     Labwork:  CBC, CMET, D-Dimer, HgbA1c, BNP, FLP - orders given today Reminder:  Nothing to eat or drink after 12 midnight prior to labs.  Testing/Procedures:  Coronary CTA Your physician has requested that you have an echocardiogram. Echocardiography is a painless test that uses sound waves to create images of your heart. It provides your doctor with information about the size and shape of your heart and how well your heart's chambers and valves are working. This procedure takes approximately one hour. There are no restrictions for this procedure. Please do NOT wear cologne, perfume, aftershave, or lotions (deodorant is allowed). Please arrive 15 minutes prior to your appointment time.  Please note: We ask at that you not bring children with you during ultrasound (echo/ vascular) testing. Due to room size and safety concerns, children are not allowed in the ultrasound rooms during exams. Our front office staff cannot provide observation of children in our lobby area while testing is being conducted. An adult accompanying a patient to their appointment will only be allowed in the ultrasound room at the discretion of the ultrasound technician under special circumstances. We apologize for any inconvenience.  Follow-Up:  4-6 weeks    Any Other Special Instructions Will Be Listed Below (If Applicable).   If you need a refill on your cardiac medications before your next appointment, please call your pharmacy.

## 2023-09-25 NOTE — Progress Notes (Signed)
 Cardiology Office Note:  .   Date:  09/25/2023 ID:  Elizabeth Mcclure, DOB [birth year], MRN [id number] PCP: Kirstie Peri, MD  [facility] Providers Cardiologist:  Nona Dell, MD    History of Present Illness: .   Elizabeth Mcclure is a 58 y.o. female with a PMH of palpitations, shortness of breath, obesity, anxiety, depression, and GERD, presents today for chest pain evaluation.  Lexiscan in July 2022 at [facility] was reassuring.  Last seen by Dr. Diona Browner on May 15, 2021.  She noted palpitations that was most concerning to her.  This also occurred with emotional stress anxiety over the past 5 to 6 months.  See monitor and echocardiogram from 2022-reports noted below.  She contacted our office recently noting intermittent CP with some SHOB.   Today she presents for CP evaluation. She describes her chest pain as a tightness along the left side, lasts for a few minutes, denies any aggravating or alleviating factors. Seems to be getting worse over time. Has not tried any medications to improve her symptoms. Admits to feeling short-winded, not able to take a full breath. Does admit to some dizzy spells, says head positions seem to trigger this. Denies any palpitations, syncope, presyncope, orthopnea, PND, swelling or significant weight changes, acute bleeding, or claudication. Does admit to some anxiety, denies any red flag signs/symptoms.   ROS: Negative. See HPI.  SH: Former smoker, seldom drinks alcohol, denies any illicit drug use.  Father - MI, PPM, passed away at 93 years ago.  Brothers 2 have had MI's in their early 81's.  Studies Reviewed: Marland Kitchen    EKG: EKG Interpretation Date/Time:  Friday September 25 2023 14:40:05 EDT Ventricular Rate:  74 PR Interval:    QRS Duration:  80 QT Interval:  384 QTC Calculation: 426 R Axis:   14  Text Interpretation: Accelerated Junctional rhythm Low voltage QRS Cannot rule out Anterior infarct , age undetermined No previous ECGs  available Confirmed by Sharlene Dory [phone]) on 09/25/2023 2:43:11 PM   Echo 06/2021:  1. Left ventricular ejection fraction, by estimation, is 60 to 65%. The  left ventricle has normal function. The left ventricle has no regional  wall motion abnormalities. There is mild left ventricular hypertrophy.  Left ventricular diastolic parameters were normal.   2. Right ventricular systolic function is normal. The right ventricular  size is normal. Tricuspid regurgitation signal is inadequate for assessing  PA pressure.   3. The mitral valve is normal in structure. No evidence of mitral valve  regurgitation. No evidence of mitral stenosis.   4. The aortic valve is tricuspid. Aortic valve regurgitation is not  visualized. No aortic stenosis is present.   5. The inferior vena cava is normal in size with greater than 50%  respiratory variability, suggesting right atrial pressure of 3 mmHg.  Monitor 05/2021:  ZIO XT. 6 days, 22 hours analyzed. Predominant rhythm is sinus with heart rate ranging from 49 bpm up to 129 bpm and average heart rate 82 bpm. There were rare PACs including couplets and triplets representing less than 1% total beats. There were rare PVCs representing less than 1% total beats. Several episodes of brief PSVT were noted, the longest of which was 16 beats. No sustained arrhythmias or pauses.  Physical Exam:   VS:  BP 130/84   Pulse 74   Ht 5\' 8"  (1.727 m)   Wt (!) 336 lb 9.6 oz (152.7 kg)   LMP 10/06/2018   SpO2 100%  BMI 51.18 kg/m    Wt Readings from Last 3 Encounters:  09/25/23 (!) 336 lb 9.6 oz (152.7 kg)  05/01/23 (!) 342 lb (155.1 kg)  05/15/21 (!) 342 lb 6.4 oz (155.3 kg)    Normal orthostatics today.   GEN: Morbidly obese, 58 y.o. female in no acute distress NECK: No JVD; No carotid bruits CARDIAC: S1/S2, RRR, no murmurs, rubs, gallops RESPIRATORY:  Clear to auscultation without rales, wheezing or rhonchi  ABDOMEN: Soft, non-tender,  non-distended EXTREMITIES:  No edema; No deformity   ASSESSMENT AND PLAN: .    Chest pain of uncertain etiology Etiology unclear. Does have several risk factors for CAD. Discussed CCTA including risks and benefits and she verbalized understanding and is agreeable to proceed. Will write Rx for one time dose of Lopressor 2 hours prior to test.  Will also write Rx for NTG PRN. Educated her on medication and she verbalized understanding. Heart healthy diet and regular cardiovascular exercise encouraged.   Shortness of breath Etiology unclear and multifactorial. Will arrange Echo in addition to workup as mentioned above. Will obtain CBC, CMET, proBNP, and d-dimer. Care and ED precautions discussed. Continue to follow with PCP.  Dizziness Etiology unclear. Orthostatics normal. Discussed conservative measures and she verbalized understanding. Care and ED precautions discussed. Continue to follow with PCP.  4. Screening for diabetes mellitus No recent labs on file. Will obtain A1C to screen for type 2 diabetes. Continue to follow with PCP.  5. Hyperlipidemia, medication management LDL 132 03/2023.  Will repeat lab work and if LDL is still not at goal, plan to discuss medication therapy at next office visit. Continue to follow with PCP.  6. Morbid obesity Weight loss via diet and exercise encouraged. Discussed the impact being overweight would have on cardiovascular risk.  Dispo: Follow-up in 4-6 weeks with me/APP or sooner if anything changes.   Signed, Sharlene Dory, NP

## 2023-10-08 ENCOUNTER — Telehealth (HOSPITAL_COMMUNITY): Payer: Self-pay | Admitting: *Deleted

## 2023-10-08 NOTE — Telephone Encounter (Signed)
 Attempted to call patient regarding upcoming cardiac CT appointment. Unable to reach or leave VM. Johney Frame RN Navigator Cardiac Imaging [facility] [phone] Office

## 2023-10-09 ENCOUNTER — Ambulatory Visit (HOSPITAL_COMMUNITY)
Admission: RE | Admit: 2023-10-09 | Discharge: 2023-10-09 | Disposition: A | Discharge status: Home / Self Care | Source: Ambulatory Visit | Attending: Nurse Practitioner | Admitting: Nurse Practitioner

## 2023-10-09 DIAGNOSIS — I2510 Atherosclerotic heart disease of native coronary artery without angina pectoris: Secondary | ICD-10-CM | POA: Insufficient documentation

## 2023-10-09 DIAGNOSIS — R079 Chest pain, unspecified: Secondary | ICD-10-CM | POA: Insufficient documentation

## 2023-10-09 MED ORDER — NITROGLYCERIN 0.4 MG SL SUBL
SUBLINGUAL_TABLET | SUBLINGUAL | Status: AC
  Filled 2023-10-09: qty 2

## 2023-10-09 MED ORDER — DILTIAZEM HCL 25 MG/5ML IV SOLN: 10.0000 mg | INTRAVENOUS | Status: DC | PRN

## 2023-10-09 MED ORDER — METOPROLOL TARTRATE 5 MG/5ML IV SOLN
INTRAVENOUS | Status: AC
  Filled 2023-10-09: qty 10

## 2023-10-09 MED ORDER — IOHEXOL 350 MG/ML SOLN
95.0000 mL | Freq: Once | INTRAVENOUS | Status: AC | PRN
  Administered 2023-10-09: 95 mL via INTRAVENOUS

## 2023-10-09 MED ORDER — METOPROLOL TARTRATE 5 MG/5ML IV SOLN: 10.0000 mg | Freq: Once | INTRAVENOUS | Status: DC | PRN

## 2023-10-09 MED ORDER — NITROGLYCERIN 0.4 MG SL SUBL
0.8000 mg | SUBLINGUAL_TABLET | Freq: Once | SUBLINGUAL | Status: AC
  Administered 2023-10-09: 0.8 mg via SUBLINGUAL

## 2023-10-15 ENCOUNTER — Encounter: Payer: Self-pay | Admitting: Podiatry

## 2023-10-15 ENCOUNTER — Ambulatory Visit: Admitting: Podiatry

## 2023-10-15 DIAGNOSIS — M722 Plantar fascial fibromatosis: Secondary | ICD-10-CM

## 2023-10-15 MED ORDER — TRIAMCINOLONE ACETONIDE 40 MG/ML IJ SUSP
40.0000 mg | Freq: Once | INTRAMUSCULAR | Status: AC
  Administered 2023-10-15: 40 mg

## 2023-10-15 NOTE — Progress Notes (Signed)
 She presents today with cardiotonics for a chief complaint of painful plantar aspect of the bilateral heels.  She states malevolent seems to hurt worse but I am sore concerned about this right foot.  Objective: Vital signs are stable alert oriented x 3.  There is no erythema Dem salines drainage noted though she does have significant pain on palpation medial calcaneal tubercle of the bilateral heels.  Left greater than right.  Assessment: Plantar fasciitis bilateral.  Plan: I injected bilateral heels today after sterile Betadine skin prep 20 mg Kenalog 5 mg Marcaine to the point maximal tenderness.  Tolerated procedure well without complications.  Discussed etiology pathology conservative versus surgical therapies discussed appropriate shoe gear stretching exercises ice therapy and shoe gear modifications.

## 2023-10-16 ENCOUNTER — Other Ambulatory Visit: Payer: Self-pay | Admitting: Nurse Practitioner

## 2023-10-16 DIAGNOSIS — R079 Chest pain, unspecified: Secondary | ICD-10-CM | POA: Diagnosis not present

## 2023-10-16 DIAGNOSIS — E785 Hyperlipidemia, unspecified: Secondary | ICD-10-CM | POA: Diagnosis not present

## 2023-10-16 DIAGNOSIS — Z79899 Other long term (current) drug therapy: Secondary | ICD-10-CM | POA: Diagnosis not present

## 2023-10-16 DIAGNOSIS — R0602 Shortness of breath: Secondary | ICD-10-CM | POA: Diagnosis not present

## 2023-10-16 MED ORDER — ASPIRIN 81 MG PO TBEC: 81.0000 mg | DELAYED_RELEASE_TABLET | Freq: Every day | ORAL | Status: DC

## 2023-10-21 ENCOUNTER — Ambulatory Visit: Attending: Nurse Practitioner

## 2023-10-21 DIAGNOSIS — R0602 Shortness of breath: Secondary | ICD-10-CM | POA: Diagnosis not present

## 2023-10-22 LAB — ECHOCARDIOGRAM COMPLETE
AR max vel: 2.38 cm2
AV Area VTI: 2.39 cm2
AV Area mean vel: 2.37 cm2
AV Mean grad: 6 mmHg
AV Peak grad: 9.7 mmHg
Ao pk vel: 1.56 m/s
Area-P 1/2: 2.62 cm2
Calc EF: 58.7 %
MV VTI: 2.97 cm2
S' Lateral: 3 cm
Single Plane A2C EF: 51.9 %
Single Plane A4C EF: 64 %

## 2023-11-12 ENCOUNTER — Ambulatory Visit: Attending: Nurse Practitioner | Admitting: Nurse Practitioner

## 2023-11-12 VITALS — BP 114/74 | HR 81 | Ht 68.0 in | Wt 328.2 lb

## 2023-11-12 DIAGNOSIS — I5032 Chronic diastolic (congestive) heart failure: Secondary | ICD-10-CM | POA: Diagnosis not present

## 2023-11-12 DIAGNOSIS — E785 Hyperlipidemia, unspecified: Secondary | ICD-10-CM | POA: Diagnosis not present

## 2023-11-12 DIAGNOSIS — R600 Localized edema: Secondary | ICD-10-CM

## 2023-11-12 DIAGNOSIS — I2510 Atherosclerotic heart disease of native coronary artery without angina pectoris: Secondary | ICD-10-CM | POA: Diagnosis not present

## 2023-11-12 DIAGNOSIS — R32 Unspecified urinary incontinence: Secondary | ICD-10-CM | POA: Diagnosis not present

## 2023-11-12 DIAGNOSIS — R42 Dizziness and giddiness: Secondary | ICD-10-CM

## 2023-11-12 NOTE — Patient Instructions (Addendum)
 Medication Instructions:  No changes *If you need a refill on your cardiac medications before your next appointment, please call your pharmacy*  Lab Work: No labs If you have labs (blood work) drawn today and your tests are completely normal, you will receive your results only by: MyChart Message (if you have MyChart) OR A paper copy in the mail If you have any lab test that is abnormal or we need to change your treatment, we will call you to review the results.  Testing/Procedures: No testing  Follow-Up: At [facility], you and your health needs are our priority.  As part of our continuing mission to provide you with exceptional heart care, our providers are all part of one team.  This team includes your primary Cardiologist (physician) and Advanced Practice Providers or APPs (Physician Assistants and Nurse Practitioners) who all work together to provide you with the care you need, when you need it.  Your next appointment:   4-6 week(s)  Provider:   Lasalle Pointer, NP  We recommend signing up for the patient portal called "MyChart".  Sign up information is provided on this After Visit Summary.  MyChart is used to connect with patients for Virtual Visits (Telemedicine).  Patients are able to view lab/test results, encounter notes, upcoming appointments, etc.  Non-urgent messages can be sent to your provider as well.   To learn more about what you can do with MyChart, go to [email or web address].   Other Instructions: We would like for you to keep a weight log: Check weight after going to the bathroom first thing in the morning

## 2023-11-12 NOTE — Progress Notes (Unsigned)
 Cardiology Office Note:  .   Date:  11/12/2023 ID:  Elizabeth Mcclure, DOB 11/10/1965, MRN [id number] PCP: Theoplis Fix, MD  [facility] Providers Cardiologist:  Teddie Favre, MD    History of Present Illness: .   Elizabeth Mcclure is a 58 y.o. female with a PMH of chest pain, palpitations, shortness of breath, obesity, anxiety, depression, and GERD, presents today for follow-up.  Lexiscan in July 2022 at [facility] was reassuring.  Last seen by Dr. Londa Rival on May 15, 2021.  She noted palpitations that was most concerning to her.  This also occurred with emotional stress anxiety over the past 5 to 6 months.  See monitor and echocardiogram from 2022-reports noted below.  She contacted our office recently noting intermittent CP with some SHOB.   09/25/2023 - Today she presents for CP evaluation. She describes her chest pain as a tightness along the left side, lasts for a few minutes, denies any aggravating or alleviating factors. Seems to be getting worse over time. Has not tried any medications to improve her symptoms. Admits to feeling short-winded, not able to take a full breath. Does admit to some dizzy spells, says head positions seem to trigger this. Denies any palpitations, syncope, presyncope, orthopnea, PND, swelling or significant weight changes, acute bleeding, or claudication. Does admit to some anxiety, denies any red flag signs/symptoms.   11/12/2023 -presents today for follow-up.  She says that her chest pain has improved a little bit, she believes that this is related to her anxiety.  Chief concern today swelling lower lower extremities and does have some intermittent shortness of breath on exertion, says she is not able to walk as much as she used to.  Does notice swelling along her lower extremities associated with long amounts of standing while working at [employer]. Denies any recent chest pain, palpitations, syncope, presyncope, dizziness, orthopnea, PND, significant  weight changes, acute bleeding, or claudication.   ROS: Negative. See HPI.  SH: Former smoker, seldom drinks alcohol , denies any illicit drug use.  Father - MI, PPM, passed away at 93 years ago.  Brothers 2 have had MI's in their early 66's.  Studies Reviewed: Aaron Aas    EKG: EKG is not ordered today.  Echo 10/2023: 1. Left ventricular ejection fraction, by estimation, is 65 to 70%. The  left ventricle has normal function. The left ventricle has no regional  wall motion abnormalities. There is mild left ventricular hypertrophy.  Left ventricular diastolic parameters  were normal. The average left ventricular global longitudinal strain is  -19.5 %. The global longitudinal strain is normal.   2. Right ventricular systolic function is normal. The right ventricular  size is normal.   3. The mitral valve is normal in structure. No evidence of mitral valve  regurgitation. No evidence of mitral stenosis.   4. The aortic valve is tricuspid. Aortic valve regurgitation is not  visualized. No aortic stenosis is present.   5. There is mild dilatation of the ascending aorta, measuring 38 mm.   6. The inferior vena cava is normal in size with greater than 50%  respiratory variability, suggesting right atrial pressure of 3 mmHg.   Comparison(s): A prior study was performed on 06/20/2021. EF 60-65%. Mild LVH. Right ventricular size is normal.   CCTA 10/2023: IMPRESSION: 1. Coronary calcium score of 175. This was 96th percentile for age, sex, and race matched control.   2. Normal coronary origin with right dominance.   3. CAD-RADS 2. Mild non-obstructive  CAD (25-49%). Consider non-atherosclerotic causes of chest pain. Consider preventive therapy and risk factor modification.  Echo 06/2021:  1. Left ventricular ejection fraction, by estimation, is 60 to 65%. The  left ventricle has normal function. The left ventricle has no regional  wall motion abnormalities. There is mild left ventricular  hypertrophy.  Left ventricular diastolic parameters were normal.   2. Right ventricular systolic function is normal. The right ventricular  size is normal. Tricuspid regurgitation signal is inadequate for assessing  PA pressure.   3. The mitral valve is normal in structure. No evidence of mitral valve  regurgitation. No evidence of mitral stenosis.   4. The aortic valve is tricuspid. Aortic valve regurgitation is not  visualized. No aortic stenosis is present.   5. The inferior vena cava is normal in size with greater than 50%  respiratory variability, suggesting right atrial pressure of 3 mmHg.  Monitor 05/2021:  ZIO XT. 6 days, 22 hours analyzed. Predominant rhythm is sinus with heart rate ranging from 49 bpm up to 129 bpm and average heart rate 82 bpm. There were rare PACs including couplets and triplets representing less than 1% total beats. There were rare PVCs representing less than 1% total beats. Several episodes of brief PSVT were noted, the longest of which was 16 beats. No sustained arrhythmias or pauses.  Physical Exam:   VS:  BP 114/74   Pulse 81   Ht 5\' 8"  (1.727 m)   Wt (!) 328 lb 3.2 oz (148.9 kg)   LMP 10/06/2018   SpO2 98%   BMI 49.90 kg/m    Wt Readings from Last 3 Encounters:  11/12/23 (!) 328 lb 3.2 oz (148.9 kg)  09/25/23 (!) 336 lb 9.6 oz (152.7 kg)  05/01/23 (!) 342 lb (155.1 kg)    GEN: Morbidly obese, 58 y.o. female in no acute distress NECK: No JVD; No carotid bruits CARDIAC: S1/S2, RRR, no murmurs, rubs, gallops RESPIRATORY:  Clear to auscultation without rales, wheezing or rhonchi  ABDOMEN: Soft, non-tender, non-distended EXTREMITIES:  No edema; No deformity   ASSESSMENT AND PLAN: .    HFpEF, leg edema Stage C, NYHA class I-II symptoms.  EF normal.  Recent BNP 310.  Difficult to ascertain volume status due to BMI.  Did discuss medication adjustment, patient declines and requests to work on weight reduction - see below. Low sodium diet, fluid  restriction <2L, and daily weights encouraged. Educated to contact our office for weight gain of 2 lbs overnight or 5 lbs in one week.  Recommended low-salt, heart healthy diet leg elevation as needed.  Did offer/recommend diuretic as needed for leg swelling, patient declines due to urinary incontinence/see below.  CAD Most recent CCTA revealed mild nonobstructive CAD. Stable with no anginal symptoms. No indication for ischemic evaluation.  Continue aspirin  and NTG PRN. Heart healthy diet and regular cardiovascular exercise encouraged.  Care and ED precautions discussed.  Dizziness Etiology unclear. Orthostatics normal. Discussed conservative measures and she verbalized understanding. Care and ED precautions discussed. Continue to follow with PCP.  5. Hyperlipidemia, medication management LDL 107 10/2023.  Most recent ASCVD risk score calculated in office today shows she does not require statin medicine at this time.  Continue to follow with PCP. Heart healthy diet and regular cardiovascular exercise encouraged.   6. Morbid obesity Weight loss via diet and exercise encouraged. Discussed the impact being overweight would have on cardiovascular risk.  Patient would be a good candidate for [facility].  Will consult clinical Pharm.D. regarding  [place].  7.  Urinary incontinence Does admit to some urinary incontinence that is bothersome per her report.  Will place referral to urologist.    Dispo: Follow-up in 4-6 weeks with me/APP or sooner if anything changes.   Signed, Lasalle Pointer, NP

## 2023-11-17 ENCOUNTER — Encounter: Payer: Self-pay | Admitting: Pharmacist

## 2023-11-17 DIAGNOSIS — M79643 Pain in unspecified hand: Secondary | ICD-10-CM | POA: Insufficient documentation

## 2023-11-17 DIAGNOSIS — G5600 Carpal tunnel syndrome, unspecified upper limb: Secondary | ICD-10-CM | POA: Insufficient documentation

## 2023-11-17 DIAGNOSIS — M4310 Spondylolisthesis, site unspecified: Secondary | ICD-10-CM | POA: Insufficient documentation

## 2023-11-17 DIAGNOSIS — M4316 Spondylolisthesis, lumbar region: Secondary | ICD-10-CM | POA: Insufficient documentation

## 2023-11-19 ENCOUNTER — Telehealth: Payer: Self-pay | Admitting: Nurse Practitioner

## 2023-11-19 NOTE — Telephone Encounter (Signed)
 Left patient a message to call back.

## 2023-11-19 NOTE — Telephone Encounter (Signed)
-----   Message from Lasalle Pointer sent at 11/18/2023  7:31 PM EDT ----- Elizabeth Mcclure with clinical pharmacist regarding [phone].  Unfortunately, pharmacist stated that based on her plan and diagnoses, it would be unlikely she would be approved.  She could do [employer] pay program, however this to be about $500 a month.  Please let patient know this information.  If she decides not to do this, recommend she discuss this further with her PCP.  Thanks!   Best, Lasalle Pointer, NP ----- Message ----- From: Sunny English, [facility] Sent: 11/18/2023   9:58 AM EDT To: Lasalle Pointer, NP  Only other option would be the [facility] cash pay program. However that is around 500 a month ----- Message ----- From: Lasalle Pointer, NP Sent: 11/17/2023   5:30 PM EDT To: Christopher Pavero, RPH  Thank you for letting me know Elizabeth Mcclure. Would there be another alternative for her that is more affordable?   Wanted to get your thoughts and input.  Thanks!   Best, Lasalle Pointer, NP ----- Message ----- From: Sunny English, [facility] Sent: 11/17/2023   5:14 PM EDT To: Lasalle Pointer, NP  Unfortunately, based on her plan and diagnoses on problem list, it is unlikely she will be approved ----- Message ----- From: Lasalle Pointer, NP Sent: 11/17/2023  10:10 AM EDT To: [facility] Pharmd  Want to see if pt would be a good candiate for [facility] and check with her insurance. Please advise.   Thanks!   Best, Lasalle Pointer, NP

## 2023-11-20 NOTE — Telephone Encounter (Signed)
 [employer] calling to check on prior auth for this medication. Please advise

## 2023-11-20 NOTE — Telephone Encounter (Signed)
 Pt requesting a c/b in regards to previous phone note.

## 2023-11-20 NOTE — Telephone Encounter (Signed)
 Patient states she cannot afford this she will followup with her pcp regarding next steps

## 2023-12-03 ENCOUNTER — Ambulatory Visit: Admitting: Podiatry

## 2023-12-03 ENCOUNTER — Encounter: Payer: Self-pay | Admitting: Podiatry

## 2023-12-03 DIAGNOSIS — M722 Plantar fascial fibromatosis: Secondary | ICD-10-CM | POA: Diagnosis not present

## 2023-12-03 NOTE — Progress Notes (Signed)
 She presents today for follow-up of her bilateral plantar fasciitis.  She says the left foot is worse both really her bad some days the toes on the left foot are starting to move sideways.    Objective: Vital signs are stable alert oriented x 3.  Pulses are palpable.  Severe plantar fasciitis bilaterally.  Assessment: Chronic intractable plantar fasciitis bilateral.  Plan: We schedule her with Trish for orthotics today.

## 2023-12-18 ENCOUNTER — Encounter: Payer: Self-pay | Admitting: Urology

## 2023-12-18 ENCOUNTER — Ambulatory Visit: Admitting: Urology

## 2023-12-18 VITALS — BP 139/82 | HR 80

## 2023-12-18 DIAGNOSIS — R32 Unspecified urinary incontinence: Secondary | ICD-10-CM | POA: Diagnosis not present

## 2023-12-18 LAB — URINALYSIS, ROUTINE W REFLEX MICROSCOPIC
Bilirubin, UA: NEGATIVE
Glucose, UA: NEGATIVE
Ketones, UA: NEGATIVE
Leukocytes,UA: NEGATIVE
Nitrite, UA: NEGATIVE
Protein,UA: NEGATIVE
RBC, UA: NEGATIVE
Specific Gravity, UA: 1.03 (ref 1.005–1.030)
Urobilinogen, Ur: 0.2 mg/dL (ref 0.2–1.0)
pH, UA: 6 (ref 5.0–7.5)

## 2023-12-18 LAB — BLADDER SCAN AMB NON-IMAGING: Scan Result: 0

## 2023-12-18 MED ORDER — GEMTESA 75 MG PO TABS
1.0000 | ORAL_TABLET | Freq: Every day | ORAL | Status: DC
Start: 2023-12-18 — End: 2024-01-29

## 2023-12-18 NOTE — Patient Instructions (Signed)
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
## 2023-12-18 NOTE — Addendum Note (Signed)
 Addended by: Jenesis Suchy L on: 12/18/2023 10:16 AM   Modules accepted: Orders

## 2023-12-18 NOTE — Progress Notes (Signed)
 post void residual=0 ?

## 2023-12-23 DIAGNOSIS — Z1231 Encounter for screening mammogram for malignant neoplasm of breast: Secondary | ICD-10-CM | POA: Diagnosis not present

## 2023-12-25 ENCOUNTER — Encounter: Payer: Self-pay | Admitting: Nurse Practitioner

## 2023-12-25 ENCOUNTER — Ambulatory Visit: Attending: Nurse Practitioner | Admitting: Nurse Practitioner

## 2023-12-25 VITALS — BP 116/70 | HR 66 | Ht 68.0 in | Wt 335.0 lb

## 2023-12-25 DIAGNOSIS — I2510 Atherosclerotic heart disease of native coronary artery without angina pectoris: Secondary | ICD-10-CM | POA: Diagnosis not present

## 2023-12-25 DIAGNOSIS — Z7689 Persons encountering health services in other specified circumstances: Secondary | ICD-10-CM

## 2023-12-25 DIAGNOSIS — R32 Unspecified urinary incontinence: Secondary | ICD-10-CM | POA: Diagnosis not present

## 2023-12-25 DIAGNOSIS — R600 Localized edema: Secondary | ICD-10-CM | POA: Diagnosis not present

## 2023-12-25 DIAGNOSIS — I5032 Chronic diastolic (congestive) heart failure: Secondary | ICD-10-CM | POA: Diagnosis not present

## 2023-12-25 DIAGNOSIS — E785 Hyperlipidemia, unspecified: Secondary | ICD-10-CM

## 2023-12-25 NOTE — Progress Notes (Unsigned)
 Cardiology Office Note:  .   Date:  11/12/2023 ID:  Elizabeth Mcclure, DOB [birth year], MRN [id number] PCP: Theoplis Fix, MD  [facility] Providers Cardiologist:  Teddie Favre, MD    History of Present Illness: .   Elizabeth Mcclure is a 58 y.o. female with a PMH of chest pain, palpitations, shortness of breath, obesity, anxiety, depression, and GERD, presents today for follow-up.  Lexiscan in July 2022 at [facility] was reassuring.  Last seen by Dr. Londa Rival on May 15, 2021.  She noted palpitations that was most concerning to her.  This also occurred with emotional stress anxiety over the past 5 to 6 months.  See monitor and echocardiogram from 2022-reports noted below.  She contacted our office recently noting intermittent CP with some SHOB.   09/25/2023 - Today she presents for CP evaluation. She describes her chest pain as a tightness along the left side, lasts for a few minutes, denies any aggravating or alleviating factors. Seems to be getting worse over time. Has not tried any medications to improve her symptoms. Admits to feeling short-winded, not able to take a full breath. Does admit to some dizzy spells, says head positions seem to trigger this. Denies any palpitations, syncope, presyncope, orthopnea, PND, swelling or significant weight changes, acute bleeding, or claudication. Does admit to some anxiety, denies any red flag signs/symptoms.   11/12/2023 -presents today for follow-up.  She says that her chest pain has improved a little bit, she believes that this is related to her anxiety.  Chief concern today swelling lower lower extremities and does have some intermittent shortness of breath on exertion, says she is not able to walk as much as she used to.  Does notice swelling along her lower extremities associated with long amounts of standing while working at [employer]. Denies any recent chest pain, palpitations, syncope, presyncope, dizziness, orthopnea, PND, significant  weight changes, acute bleeding, or claudication.  12/25/2023 - Tells me her weight has been up slightly recently due to starting new medication by urologist.  She has noticed improvement in her urinary incontinence since starting it.  She tells me she has now proceeded to take this every other day to help with fluid management.  Frustrated and chief concern is weight loss.  She called her insurance company to see if she be approved for [employer] and they stated yes, and I discussed with her that I consulted clinical pharmacist Joelene Murrain) who stated, Unfortunately, based on her plan and diagnoses on problem list, it is unlikely she will be approved.  I discussed with her, and she verbalized understanding.  She tells me she wants to work on weight loss to also address other issues such as her back pain and potential foot surgery in the future. Denies any chest pain, palpitations, syncope, presyncope, dizziness, orthopnea, PND, acute bleeding, or claudication.  Otherwise, she says she is doing well.  Breathing has improved, does admit that summer humidity affects her breathing.   ROS: Negative. See HPI.  SH: Former smoker, seldom drinks alcohol , denies any illicit drug use.  Father - MI, PPM, passed away at 93 years ago.  Brothers 2 have had MI's in their early 38's.  Studies Reviewed: Aaron Aas    EKG: EKG is not ordered today.  Echo 10/2023: 1. Left ventricular ejection fraction, by estimation, is 65 to 70%. The  left ventricle has normal function. The left ventricle has no regional  wall motion abnormalities. There is mild left ventricular hypertrophy.  Left ventricular diastolic parameters  were normal. The average left ventricular global longitudinal strain is  -19.5 %. The global longitudinal strain is normal.   2. Right ventricular systolic function is normal. The right ventricular  size is normal.   3. The mitral valve is normal in structure. No evidence of mitral valve  regurgitation. No  evidence of mitral stenosis.   4. The aortic valve is tricuspid. Aortic valve regurgitation is not  visualized. No aortic stenosis is present.   5. There is mild dilatation of the ascending aorta, measuring 38 mm.   6. The inferior vena cava is normal in size with greater than 50%  respiratory variability, suggesting right atrial pressure of 3 mmHg.   Comparison(s): A prior study was performed on 06/20/2021. EF 60-65%. Mild LVH. Right ventricular size is normal.   CCTA 10/2023: IMPRESSION: 1. Coronary calcium score of 175. This was 96th percentile for age, sex, and race matched control.   2. Normal coronary origin with right dominance.   3. CAD-RADS 2. Mild non-obstructive CAD (25-49%). Consider non-atherosclerotic causes of chest pain. Consider preventive therapy and risk factor modification.  Echo 06/2021:  1. Left ventricular ejection fraction, by estimation, is 60 to 65%. The  left ventricle has normal function. The left ventricle has no regional  wall motion abnormalities. There is mild left ventricular hypertrophy.  Left ventricular diastolic parameters were normal.   2. Right ventricular systolic function is normal. The right ventricular  size is normal. Tricuspid regurgitation signal is inadequate for assessing  PA pressure.   3. The mitral valve is normal in structure. No evidence of mitral valve  regurgitation. No evidence of mitral stenosis.   4. The aortic valve is tricuspid. Aortic valve regurgitation is not  visualized. No aortic stenosis is present.   5. The inferior vena cava is normal in size with greater than 50%  respiratory variability, suggesting right atrial pressure of 3 mmHg.  Monitor 05/2021:  ZIO XT. 6 days, 22 hours analyzed. Predominant rhythm is sinus with heart rate ranging from 49 bpm up to 129 bpm and average heart rate 82 bpm. There were rare PACs including couplets and triplets representing less than 1% total beats. There were rare PVCs  representing less than 1% total beats. Several episodes of brief PSVT were noted, the longest of which was 16 beats. No sustained arrhythmias or pauses.  Physical Exam:   VS:  Ht 5' 8 (1.727 m)   Wt (!) 335 lb (152 kg)   LMP 10/06/2018   BMI 50.94 kg/m    Wt Readings from Last 3 Encounters:  12/25/23 (!) 335 lb (152 kg)  11/12/23 (!) 328 lb 3.2 oz (148.9 kg)  09/25/23 (!) 336 lb 9.6 oz (152.7 kg)    GEN: Morbidly obese, 58 y.o. female in no acute distress NECK: No JVD; No carotid bruits CARDIAC: S1/S2, RRR, no murmurs, rubs, gallops RESPIRATORY:  Clear to auscultation without rales, wheezing or rhonchi  ABDOMEN: Soft, non-tender, non-distended EXTREMITIES:  No edema; No deformity   ASSESSMENT AND PLAN: .    HFpEF, leg edema Stage C, NYHA class I-II symptoms.  EF normal.  Recent BNP 310.  Difficult to ascertain volume status due to BMI.  Did discuss medication adjustment, patient declines and requests to work on weight reduction - see below. Low sodium diet, fluid restriction <2L, and daily weights encouraged. Educated to contact our office for weight gain of 2 lbs overnight or 5 lbs in one week.  Recommended low-salt,  heart healthy diet leg elevation as needed.  Did offer/recommend diuretic as needed for leg swelling, patient declines due to urinary incontinence/see below.  CAD Most recent CCTA revealed mild nonobstructive CAD. Stable with no anginal symptoms. No indication for ischemic evaluation.  Continue aspirin  and NTG PRN. Heart healthy diet and regular cardiovascular exercise encouraged.  Care and ED precautions discussed.  Dizziness Etiology unclear. Orthostatics normal. Discussed conservative measures and she verbalized understanding. Care and ED precautions discussed. Continue to follow with PCP.  5. Hyperlipidemia LDL 107 10/2023.  Most recent ASCVD risk score calculated in office today shows she does not require statin medicine at this time.  Continue to follow with PCP.  Heart healthy diet and regular cardiovascular exercise encouraged.   6. Morbid obesity Weight loss via diet and exercise encouraged. Discussed the impact being overweight would have on cardiovascular risk.  Patient would be a good candidate for [facility].  Will consult clinical Pharm.D. regarding [id number].   7.  Urinary incontinence Does admit to some urinary incontinence that is bothersome per her report.  Will place referral to urologist.    Refer to healthy weight and wellness Dr. Alvia Awkward   Dispo: Follow-up in 4-6 weeks with me/[facility] or sooner if anything changes.   Signed, Lasalle Pointer, NP

## 2023-12-25 NOTE — Patient Instructions (Addendum)
 Medication Instructions:  Your physician recommends that you continue on your current medications as directed. Please refer to the Current Medication list given to you today.  Labwork: None   Testing/Procedures: None   Follow-Up: Your physician recommends that you schedule a follow-up appointment in: 2-3 Months   Any Other Special Instructions Will Be Listed Below (If Applicable).  If you need a refill on your cardiac medications before your next appointment, please call your pharmacy.  HEART FAILURE INSTRUCTION SHEET  Follow a low-salt diet-you are allowed no more than 2,000 mg of sodium per day. Watch your fluid intake. In general, you should not be taking more than 64 ounces a day (no more than 8 glasses per day). Sometimes we refer to this as 2 liters per day. This includes sources of water  in food like soup, coffee, tea, milk etc. Weigh yourself on the same scale at the same time of the day preferably immediately after your first void. Keep a log of your weights. Call your doctor: (Anytime you feel any of the following symptoms)  3 lbs weight gain overnight or 5 lbs within a week Shortness of breath, with or without a day hacking cough Swelling in hands, feet or stomach If you have to sleep on extra pillows at night in order to breathe   IT IS IMPORTANT TO LET YOUR DOCTOR KNOW EARLY ON IF YOU ARE HAVING SYMPTOMS SO WE CAN HELP YOU!

## 2023-12-31 ENCOUNTER — Encounter (INDEPENDENT_AMBULATORY_CARE_PROVIDER_SITE_OTHER): Payer: Self-pay

## 2024-01-06 ENCOUNTER — Other Ambulatory Visit

## 2024-01-28 DIAGNOSIS — N3946 Mixed incontinence: Secondary | ICD-10-CM | POA: Insufficient documentation

## 2024-01-28 NOTE — Progress Notes (Unsigned)
 Name: Elizabeth Mcclure DOB: January 13, 1966 MRN: [id number]  History of Present Illness: Elizabeth Mcclure is a 58 y.o. female who presents today for follow up visit at [facility].  Relevant History includes: 1. Mixed urinary incontinence (urge predominant).  At 1st Urology visit with Dr. Sherrilee on 12/18/2023: The plan was trial of Gemtesa  (Vibegron ) 75 mg daily.   Today: She {Actions; denies-reports:120008} symptomatic improvement since starting Gemtesa  (Vibegron ) 75 mg daily.   She reports {Blank multiple:19197::improved,persistent / unchanged} urinary ***frequency, ***nocturia x***, ***urgency, and ***urge incontinence.  Voiding ***x/day and ***x/night on average.  Leaking ***x/day on average; using *** ***pads / ***diapers per day on average.  She {Actions; denies-reports:120008} significant caffeine intake (*** caffeinated beverages per day on average).  She {Actions; denies-reports:120008} dysuria, gross hematuria, straining to void, or sensations of incomplete emptying.  Medications: Current Outpatient Medications  Medication Sig Dispense Refill   aspirin  EC 81 MG tablet Take 1 tablet (81 mg total) by mouth daily. Swallow whole.     Multiple Vitamin (MULTIVITAMIN ADULT PO) Take by mouth.     nitroGLYCERIN  (NITROSTAT ) 0.4 MG SL tablet Place 1 tablet (0.4 mg total) under the tongue every 5 (five) minutes as needed for chest pain. 25 tablet 3   Turmeric (QC TUMERIC COMPLEX PO) Take by mouth.     Vibegron  (GEMTESA ) 75 MG TABS Take 1 tablet (75 mg total) by mouth daily.     No current facility-administered medications for this visit.    Allergies: Allergies  Allergen Reactions   Benadryl [Diphenhydramine] Anaphylaxis and Hives   Penicillins Hives    Has patient had a PCN reaction causing immediate rash, facial/tongue/throat swelling, SOB or lightheadedness with hypotension:unsure Has patient had a PCN reaction causing severe rash involving mucus membranes or  skin necrosis:unsure Has patient had a PCN reaction that required hospitalization:No Has patient had a PCN reaction occurring within the last 10 years:No If all of the above answers are NO, then may proceed with Cephalosporin use.     Past Medical History:  Diagnosis Date   Anxiety    Arthritis    Carpal tunnel syndrome    Constipation    Depression    GERD (gastroesophageal reflux disease)    History of pneumonia    Osteoarthritis    Varicose veins of right leg with edema    pt states has had vein closure twice in this leg saw Dr Early 5 years ago pt states has not seen since edema decreases with elevation    Past Surgical History:  Procedure Laterality Date   ablation right great sapheous vein      02/09/2008   ANTERIOR CERVICAL DECOMP/DISCECTOMY FUSION N/A 11/12/2018   Procedure: ANTERIOR CERVICAL DECOMPRESSION/DISCECTOMY FUSION CERVICAL FIVE- CERVICAL SIX, CERVICAL SIX- CERVICAL SEVEN;  Surgeon: Louis Shove, MD;  Location: [facility] OR;  Service: Neurosurgery;  Laterality: N/A;  ANTERIOR CERVICAL DECOMPRESSION/DISCECTOMY FUSION CERVICAL FIVE- CERVICAL SIX, CERVICAL SIX- CERVICAL SEVEN   CARPAL TUNNEL RELEASE Right    CHOLECYSTECTOMY     COLONOSCOPY  05/24/2012   Procedure: COLONOSCOPY;  Surgeon: Margo LITTIE Haddock, MD;  Location: [facility];  Service: Endoscopy;  Laterality: N/A;  9:30   DILATION AND CURETTAGE OF UTERUS     JOINT REPLACEMENT     left knee arthroscopy     stab phlebectomy      02/2008   TOTAL KNEE ARTHROPLASTY Left 01/18/2015   Procedure: LEFT TOTAL KNEE ARTHROPLASTY;  Surgeon: Redell Shoals, MD;  Location: [facility] ORS;  Service: Orthopedics;  Laterality: Left;   vein closure     X2, needs vein stripping   VEIN LIGATION AND STRIPPING Right 07/25/2016   Procedure: LIGATION AND STRIPPING GREATER SAPHENOUS VEIN; 10-20 STAB PHLEBECTOMY;  Surgeon: Krystal JULIANNA Doing, MD;  Location: [facility] OR;  Service: Vascular;  Laterality: Right;   venous duplex      hx of    Family History   Problem Relation Age of Onset   Colon cancer Mother    Hypertension Mother    Arthritis Father    Hypertension Father    Heart disease Father    Cancer Sister    Heart disease Sister    Heart disease Brother    Social History   Socioeconomic History   Marital status: Married    Spouse name: Not on file   Number of children: Not on file   Years of education: Not on file   Highest education level: Not on file  Occupational History   Not on file  Tobacco Use   Smoking status: Former    Current packs/day: 0.25    Average packs/day: 0.3 packs/day for 3.0 years (0.8 ttl pk-yrs)    Types: Cigarettes   Smokeless tobacco: Never   Tobacco comments:    Quit smoking x 13 years  Vaping Use   Vaping status: Never Used  Substance and Sexual Activity   Alcohol  use: Yes    Comment: 2-3 drinks a year   Drug use: No   Sexual activity: Not on file  Other Topics Concern   Not on file  Social History Narrative   Not on file   Social Drivers of Health   Financial Resource Strain: Not on file  Food Insecurity: Not on file  Transportation Needs: Not on file  Physical Activity: Not on file  Stress: Not on file  Social Connections: Not on file  Intimate Partner Violence: Not At Risk (05/13/2022)   Received from [facility]   Humiliation, Afraid, Rape, and Kick questionnaire    Within the last year, have you been afraid of your partner or ex-partner?: No    Within the last year, have you been humiliated or emotionally abused in other ways by your partner or ex-partner?: No    Within the last year, have you been kicked, hit, slapped, or otherwise physically hurt by your partner or ex-partner?: No    Within the last year, have you been raped or forced to have any kind of sexual activity by your partner or ex-partner?: No    Review of Systems Constitutional: Patient denies any unintentional weight loss or change in strength lntegumentary: Patient denies any rashes or pruritus Eyes:  Patient {Actions; denies-reports:120008} dry eyes ENT: Patient {Actions; denies-reports:120008} dry mouth Cardiovascular: Patient denies chest pain or syncope Respiratory: Patient denies shortness of breath Gastrointestinal: Patient {Actions; denies-reports:120008} constipation Musculoskeletal: Patient denies muscle cramps or weakness Neurologic: Patient denies convulsions or seizures Allergic/Immunologic: Patient denies recent allergic reaction(s) Hematologic/Lymphatic: Patient denies bleeding tendencies Endocrine: Patient denies heat/cold intolerance  GU: As per HPI.  OBJECTIVE There were no vitals filed for this visit. There is no height or weight on file to calculate BMI.  Physical Examination Constitutional: No obvious distress; patient is non-toxic appearing  Cardiovascular: No visible lower extremity edema.  Respiratory: The patient does not have audible wheezing/stridor; respirations do not appear labored  Gastrointestinal: Abdomen non-distended Musculoskeletal: Normal ROM of UEs  Skin: No obvious rashes/open sores  Neurologic: CN 2-12 grossly intact Psychiatric: Answered questions  appropriately with normal affect  Hematologic/Lymphatic/Immunologic: No obvious bruises or sites of spontaneous bleeding  UA: ***negative ***positive for *** leukocytes, *** blood, ***nitrites Urine microscopy: *** WBC/hpf, *** RBC/hpf, *** bacteria ***glucosuria (secondary to ***Jardiance ***Farxiga use) ***otherwise unremarkable  PVR: *** ml  ASSESSMENT No diagnosis found.  We discussed the symptoms of overactive bladder (OAB), which include urinary urgency, frequency, nocturia, with or without urge incontinence.  While we may not know the exact etiology of OAB, several risk factors can be identified.  - Patient's neurogenic risk factors: ***T2DM ***with neuropathy, ***nicotine use, ***spinal stenosis, ***prior stroke, ***dementia.  - Patient's exacerbating factors include: ***diuretic  use, ***caffeine intake, ***glucosuria (due to ***Jardiance / ***Farxiga use), ***ambulatory dysfunction (functional incontinence).   We discussed the following management options in detail including potential benefits, risks, and side effects: Behavioral therapy: Modify fluid intake Minimize / avoid bladder irritants (such as caffeine, spicy foods, acidic foods, alcohol ) Bladder retraining / timed voiding Double voiding Medication(s): ***- We discussed potential side effects of anticholinergic medications such as urinary retention, dry eyes, dry mouth, constipation, confusion, cognitive impairment / dementia.  ***- Not a safe candidate for anticholinergic medications due to risk for side effects based on patient's age, comorbidities, and pre-existing ***dry mouth ***dry eyes ***constipation ***dementia ***Parkinsons disease ***MS.  ***- Beta-3 agonist medications: We discussed potential side effects of beta-3 agonist medications such as urinary retention and (infrequently) elevated blood pressure.  ***- Combination therapy with anticholinergic medication + beta-3 agonist medication. 3. For refractory cases: PTNS (posterior tibial nerve stimulation) ***Not a safe candidate for PTNS due to ***bleeding disorder, ***anticoagulant use, ***pregnancy, ***pacemaker, ***implanted cardiac defibrillator (ICD), ***neuropathy / nerve damage / nerve conduction disorder, ***lower extremity metal implant(s).  Sacral neuromodulation trial (Medtronic lnterStim or Axonics implant) Bladder Botox injections  ***Consider discussing possible alternatives to ***Jardiance ***Farxiga with prescribing provider. This medication causes excess sugar to be excreted into the urine. That can prompt the kidneys to put out more water  to dilute that sugar in the urine and it can also irritate the bladder lining, both of which may contribute to OAB symptoms (urinary frequency, urgency, and urge incontinence).  She decided to  proceed with *** ***behavioral modifications including ***minimizing / avoiding caffeine intake and working on ***timed voiding / bladder retraining.  Will plan for follow up in *** weeks / *** months or sooner if needed. Patient verbalized understanding and agreement. All questions were answered.  PLAN Advised the following: ***. *** ***. Minimize / avoid caffeine intake. ***. Work on timed voiding / bladder retraining. ***. No follow-ups on file.  No orders of the defined types were placed in this encounter.   It has been explained that the patient is to follow regularly with their PCP in addition to all other providers involved in their care and to follow instructions provided by these respective offices. Patient advised to contact urology clinic if any urologic-pertaining questions, concerns, new symptoms or problems arise in the interim period.  There are no Patient Instructions on file for this visit.  Electronically signed by:  Lauraine JAYSON Oz, FNP   01/28/24    9:46 AM

## 2024-01-29 ENCOUNTER — Encounter: Payer: Self-pay | Admitting: Urology

## 2024-01-29 ENCOUNTER — Ambulatory Visit: Admitting: Urology

## 2024-01-29 VITALS — BP 123/85 | HR 73

## 2024-01-29 DIAGNOSIS — K5900 Constipation, unspecified: Secondary | ICD-10-CM | POA: Diagnosis not present

## 2024-01-29 DIAGNOSIS — R682 Dry mouth, unspecified: Secondary | ICD-10-CM | POA: Diagnosis not present

## 2024-01-29 DIAGNOSIS — H5789 Other specified disorders of eye and adnexa: Secondary | ICD-10-CM | POA: Diagnosis not present

## 2024-01-29 DIAGNOSIS — N3946 Mixed incontinence: Secondary | ICD-10-CM

## 2024-01-29 DIAGNOSIS — F1590 Other stimulant use, unspecified, uncomplicated: Secondary | ICD-10-CM

## 2024-01-29 DIAGNOSIS — N3281 Overactive bladder: Secondary | ICD-10-CM | POA: Diagnosis not present

## 2024-01-29 DIAGNOSIS — Z789 Other specified health status: Secondary | ICD-10-CM

## 2024-01-29 DIAGNOSIS — H04123 Dry eye syndrome of bilateral lacrimal glands: Secondary | ICD-10-CM

## 2024-01-29 LAB — BLADDER SCAN AMB NON-IMAGING: Scan Result: 8

## 2024-01-29 LAB — URINALYSIS, ROUTINE W REFLEX MICROSCOPIC
Bilirubin, UA: NEGATIVE
Glucose, UA: NEGATIVE
Ketones, UA: NEGATIVE
Nitrite, UA: NEGATIVE
Protein,UA: NEGATIVE
RBC, UA: NEGATIVE
Specific Gravity, UA: 1.025 (ref 1.005–1.030)
Urobilinogen, Ur: 0.2 mg/dL (ref 0.2–1.0)
pH, UA: 6 (ref 5.0–7.5)

## 2024-01-29 LAB — MICROSCOPIC EXAMINATION

## 2024-01-29 MED ORDER — GEMTESA 75 MG PO TABS: 1.0000 | ORAL_TABLET | Freq: Every day | ORAL | 11 refills | Status: AC

## 2024-01-29 NOTE — Patient Instructions (Addendum)
 SABRA

## 2024-02-07 NOTE — Progress Notes (Unsigned)
 Office: [phone]  /  Fax: [phone]   Initial Visit    Elizabeth Mcclure was seen in clinic today to evaluate for obesity. She is interested in losing weight to improve overall health and reduce the risk of weight related complications. She presents today to review program treatment options, initial physical assessment, and evaluation.     She was referred by: {emreferby:28303}  When asked what else they would like to accomplish? She states: {EMHopetoaccomplish:28304::Adopt a healthier eating pattern and lifestyle,Improve energy levels and physical activity,Improve existing medical conditions,Improve quality of life}  When asked how has your weight affected you? She states: {EMWeightAffected:28305}  Weight history: ***  Highest weight: ***  Some associated conditions: {EMSomeConditions:28306}  Contributing factors: {EMcontributingfactors:28307}  Weight promoting medications identified: {EMWeightpromotingrx:28308}  Prior weight loss attempts: {emweightlossprograms:31590::None}  Current nutrition plan: {EMNutritionplan:28309::None}  Current level of physical activity: {EMcurrentPA:28310::None}  Current or previous pharmacotherapy: {EM previousRx:28311}  Response to medication: {EMResponsetomedication:28312}   Past medical history includes:   Past Medical History:  Diagnosis Date   Anxiety    Arthritis    Carpal tunnel syndrome    Constipation    Depression    GERD (gastroesophageal reflux disease)    History of pneumonia    Osteoarthritis    Varicose veins of right leg with edema    pt states has had vein closure twice in this leg saw Dr Early 5 years ago pt states has not seen since edema decreases with elevation      Objective    LMP 10/06/2018  She was weighed on the bioimpedance scale: There is no height or weight on file to calculate BMI.  Body Fat%:***, Visceral Fat Rating:***, Weight trend over the last 12 months:  {emweighttrend:28333}  General:  Alert, oriented and cooperative. Patient is in no acute distress.  Respiratory: Normal respiratory effort, no problems with respiration noted   Gait: able to ambulate independently  Mental Status: Normal mood and affect. Normal behavior. Normal judgment and thought content.   DIAGNOSTIC DATA REVIEWED:  BMET    Component Value Date/Time   NA 137 11/11/2018 1348   K 4.0 11/11/2018 1348   CL 103 11/11/2018 1348   CO2 24 11/11/2018 1348   GLUCOSE 138 (H) 11/11/2018 1348   BUN 12 11/11/2018 1348   CREATININE 0.84 11/11/2018 1348   CALCIUM 9.1 11/11/2018 1348   GFRNONAA >60 11/11/2018 1348   GFRAA >60 11/11/2018 1348   No results found for: HGBA1C No results found for: INSULIN CBC    Component Value Date/Time   WBC 7.1 11/11/2018 1348   RBC 5.51 (H) 11/11/2018 1348   HGB 15.0 11/11/2018 1348   HCT 47.1 (H) 11/11/2018 1348   PLT 176 11/11/2018 1348   MCV 85.5 11/11/2018 1348   MCH 27.2 11/11/2018 1348   MCHC 31.8 11/11/2018 1348   RDW 13.9 11/11/2018 1348   Iron/TIBC/Ferritin/ %Sat No results found for: IRON, TIBC, FERRITIN, IRONPCTSAT Lipid Panel  No results found for: CHOL, TRIG, HDL, CHOLHDL, VLDL, LDLCALC, LDLDIRECT Hepatic Function Panel     Component Value Date/Time   PROT 6.8 02/11/2015 1100   ALBUMIN 3.5 02/11/2015 1100   AST 19 02/11/2015 1100   ALT 11 (L) 02/11/2015 1100   ALKPHOS 78 02/11/2015 1100   BILITOT 0.2 (L) 02/11/2015 1100   No results found for: TSH   Assessment and Plan   HNP (herniated nucleus pulposus), cervical  Lumbosacral spondylosis without myelopathy  Spondylolisthesis, lumbar region  Lumbar radiculopathy  History of cervical spinal  arthrodesis  Mixed stress and urge urinary incontinence  OAB (overactive bladder)  Primary osteoarthritis of left knee    Assessment and Plan Assessment & Plan         Obesity Treatment / Action  Plan:  {EMobesityactionplanscribe:28314::Patient will work on garnering support from family and friends to begin weight loss journey.,Will work on eliminating or reducing the presence of highly palatable, calorie dense foods in the home.,Will complete provided nutritional and psychosocial assessment questionnaire before the next appointment.,Will be scheduled for indirect calorimetry to determine resting energy expenditure in a fasting state.  This will allow us  to create a reduced calorie, high-protein meal plan to promote loss of fat mass while preserving muscle mass.,Counseled on the health benefits of losing 5%-15% of total body weight.,Was counseled on nutritional approaches to weight loss and benefits of reducing processed foods and consuming plant-based foods and high quality protein as part of nutritional weight management.,Was counseled on pharmacotherapy and role as an adjunct in weight management. }  Obesity Education Performed Today:  She was weighed on the bioimpedance scale and results were discussed and documented in the synopsis.  We discussed obesity as a disease and the importance of a more detailed evaluation of all the factors contributing to the disease.  We discussed the importance of long term lifestyle changes which include nutrition, exercise and behavioral modifications as well as the importance of customizing this to her specific health and social needs.  We discussed the benefits of reaching a healthier weight to alleviate the symptoms of existing conditions and reduce the risks of the biomechanical, metabolic and psychological effects of obesity.  We reviewed the four pillars of obesity medicine and importance of using a multimodal approach.  We reviewed the basic principles in weight management.   Elizabeth Mcclure appears to be in the action stage of change and states they are ready to start intensive lifestyle modifications and behavioral  modifications.  I have spent *** minutes in the care of the patient today including: {NUMBER 1-10:22536} minutes before the visit reviewing and preparing the chart. *** minutes face-to-face {emfacetoface:32598::assessing and reviewing listed medical problems as outlined in obesity care plan,providing nutritional and behavioral counseling on topics outlined in the obesity care plan,independently interpreting test results and goals of care, as described in assessment and plan,reviewing and discussing biometric information and progress} {NUMBER 1-10:22536} minutes after the visit updating chart and documentation of encounter.  Reviewed by clinician on day of visit: allergies, medications, problem list, medical history, surgical history, family history, social history, and previous encounter notes pertinent to obesity diagnosis.   Lucas Parker, MD

## 2024-02-08 ENCOUNTER — Ambulatory Visit (INDEPENDENT_AMBULATORY_CARE_PROVIDER_SITE_OTHER): Admitting: Physician Assistant

## 2024-02-08 ENCOUNTER — Encounter (INDEPENDENT_AMBULATORY_CARE_PROVIDER_SITE_OTHER): Payer: Self-pay | Admitting: Physician Assistant

## 2024-02-08 VITALS — BP 135/84 | HR 65 | Temp 98.0°F | Ht 66.0 in | Wt 334.0 lb

## 2024-02-08 DIAGNOSIS — M25561 Pain in right knee: Secondary | ICD-10-CM

## 2024-02-08 DIAGNOSIS — M5416 Radiculopathy, lumbar region: Secondary | ICD-10-CM | POA: Diagnosis not present

## 2024-02-08 DIAGNOSIS — M5020 Other cervical disc displacement, unspecified cervical region: Secondary | ICD-10-CM

## 2024-02-08 DIAGNOSIS — I2510 Atherosclerotic heart disease of native coronary artery without angina pectoris: Secondary | ICD-10-CM

## 2024-02-08 DIAGNOSIS — E66813 Obesity, class 3: Secondary | ICD-10-CM

## 2024-02-08 DIAGNOSIS — M47817 Spondylosis without myelopathy or radiculopathy, lumbosacral region: Secondary | ICD-10-CM

## 2024-02-08 DIAGNOSIS — Z0289 Encounter for other administrative examinations: Secondary | ICD-10-CM

## 2024-02-08 DIAGNOSIS — F419 Anxiety disorder, unspecified: Secondary | ICD-10-CM

## 2024-02-08 DIAGNOSIS — N3281 Overactive bladder: Secondary | ICD-10-CM | POA: Diagnosis not present

## 2024-02-08 DIAGNOSIS — Z981 Arthrodesis status: Secondary | ICD-10-CM

## 2024-02-08 DIAGNOSIS — R7303 Prediabetes: Secondary | ICD-10-CM

## 2024-02-08 DIAGNOSIS — N3946 Mixed incontinence: Secondary | ICD-10-CM

## 2024-02-08 DIAGNOSIS — M4316 Spondylolisthesis, lumbar region: Secondary | ICD-10-CM | POA: Diagnosis not present

## 2024-02-08 DIAGNOSIS — Z6841 Body Mass Index (BMI) 40.0 and over, adult: Secondary | ICD-10-CM

## 2024-02-08 DIAGNOSIS — M1712 Unilateral primary osteoarthritis, left knee: Secondary | ICD-10-CM

## 2024-02-26 ENCOUNTER — Ambulatory Visit: Admitting: Nurse Practitioner

## 2024-03-09 ENCOUNTER — Ambulatory Visit (INDEPENDENT_AMBULATORY_CARE_PROVIDER_SITE_OTHER): Admitting: Family Medicine

## 2024-03-09 ENCOUNTER — Ambulatory Visit: Payer: Self-pay

## 2024-03-09 ENCOUNTER — Encounter (INDEPENDENT_AMBULATORY_CARE_PROVIDER_SITE_OTHER): Payer: Self-pay | Admitting: Family Medicine

## 2024-03-09 VITALS — BP 152/87 | HR 62 | Temp 97.5°F | Ht 66.0 in | Wt 338.0 lb

## 2024-03-09 DIAGNOSIS — Z6841 Body Mass Index (BMI) 40.0 and over, adult: Secondary | ICD-10-CM

## 2024-03-09 DIAGNOSIS — E559 Vitamin D deficiency, unspecified: Secondary | ICD-10-CM

## 2024-03-09 DIAGNOSIS — R5383 Other fatigue: Secondary | ICD-10-CM | POA: Diagnosis not present

## 2024-03-09 DIAGNOSIS — R0609 Other forms of dyspnea: Secondary | ICD-10-CM

## 2024-03-09 DIAGNOSIS — M545 Low back pain, unspecified: Secondary | ICD-10-CM | POA: Diagnosis not present

## 2024-03-09 DIAGNOSIS — R7303 Prediabetes: Secondary | ICD-10-CM

## 2024-03-09 DIAGNOSIS — Z1331 Encounter for screening for depression: Secondary | ICD-10-CM

## 2024-03-09 DIAGNOSIS — M62830 Muscle spasm of back: Secondary | ICD-10-CM | POA: Diagnosis not present

## 2024-03-09 DIAGNOSIS — M5416 Radiculopathy, lumbar region: Secondary | ICD-10-CM | POA: Diagnosis not present

## 2024-03-09 NOTE — Telephone Encounter (Signed)
 Copied from CRM #[id number]. Topic: Clinical - Red Word Triage >> Mar 09, 2024 10:16 AM Rosaria BRAVO wrote: Red Word that prompted transfer to Nurse Triage: Severe pain, cannot get out of bed without crying Reason for Disposition  [1] SEVERE pain (e.g., excruciating, unable to do any normal activities) AND [2] not improved after 2 hours of pain medicine  Answer Assessment - Initial Assessment Questions Getting out of bed is very difficulty. Pt had history with right hip/bulging disc.  Pt is wanting set up PCP with [facility]. Soonest appt is 11/17. RN advised UC or calling Ortho office. Pt stated will call go to UC.      1. LOCATION and RADIATION: Where is the pain located? Does the pain spread (shoot) anywhere else?    both 2. QUALITY: What does the pain feel like?  (e.g., sharp, dull, aching, burning)     sharp 3. SEVERITY: How bad is the pain? What does it keep you from doing?   (Scale 1-10; or mild, moderate, severe)     Severe-crying 4. ONSET: When did the pain start? Does it come and go, or is it there all the time?     3 days ago 5. WORK OR EXERCISE: Has there been any recent work or exercise that involved this part of the body?      na 6. CAUSE: What do you think is causing the hip pain?      Bulging disc, sciatica  7. AGGRAVATING FACTORS: What makes the hip pain worse? (e.g., walking, climbing stairs, running)     Getting up from lying down 8. OTHER SYMPTOMS: Do you have any other symptoms? (e.g., back pain, pain shooting down leg,  fever, rash)     Pain radiates down into legs  Protocols used: Hip Pain-A-AH

## 2024-03-09 NOTE — Progress Notes (Signed)
 Office: [phone]  /  Fax: [phone]  WEIGHT SUMMARY AND BIOMETRICS  Anthropometric Measurements Height: 5' 6 (1.676 m) Weight: (!) 338 lb (153.3 kg) BMI (Calculated): 54.58 Starting Weight: 338 lb Peak Weight: 400 lb Waist Measurement : 54.5 inches   Body Composition  Body Fat %: 61.4 % Fat Mass (lbs): 207.8 lbs Muscle Mass (lbs): 124 lbs Visceral Fat Rating : 25   Other Clinical Data RMR: 2203 Fasting: yes Labs: yes Today's Visit #: First Starting Date: 03/09/24 Comments: First Visit    Chief Complaint: OBESITY   History of Present Illness Elizabeth Mcclure is a 58 year old female with obesity who presents for a workup to assess the ideal treatment plan.  She has experienced significant weight fluctuations, previously losing weight from 400 lbs to 238 lbs, but struggles to maintain the weight loss. She is not interested in weight loss surgery and prefers non-surgical interventions. Her work at [employer] involves five-hour shifts, which are physically challenging due to her back pain.  She experiences persistent fatigue, which she feels is excessive. She also has dyspnea on exertion, potentially related to exercise intolerance. A history of vitamin D deficiency is noted.  There is a family history of degenerative back disease, and she has had multiple back and joint issues herself. She experiences significant back pain, which has been particularly severe over the past three days, making it difficult for her to get out of bed and requiring her to lean forward when sitting. She has had multiple surgeries in the past and is not inclined towards further surgical procedures.  She lives alone and manages her own cooking, although standing for long periods can be painful. She prefers bland foods and has cravings in the middle of the day, but does not eat past 7 PM. She has a preference for starchy foods and chips, which she considers her worst habits. She has been working  on portion control by using smaller plates.  She reports getting adequate sleep, averaging about seven hours per night, although recent back pain has disrupted her sleep. She has a history of anxiety and stress, which was previously managed with a low dose of Xanax , but she no longer has access to this medication. She prefers to manage her health through non-pharmacological means.  Testing today includes:  Basal Metabolic Rate calculated via Bioimpedance scale 1995 [employer]ing [employer] Expenditure measured via Indirect Calorimeter 2203 and is as expected. PHQ-9 score was mild at 6  Epworth Sleepiness Score was normal at 3       PHYSICAL EXAM:  Blood pressure (!) 152/87, pulse 62, temperature (!) 97.5 F (36.4 C), height 5' 6 (1.676 m), weight (!) 338 lb (153.3 kg), last menstrual period 10/06/2018, SpO2 99%. Body mass index is 54.55 kg/m.  DIAGNOSTIC DATA REVIEWED:  BMET    Component Value Date/Time   NA 137 11/11/2018 1348   K 4.0 11/11/2018 1348   CL 103 11/11/2018 1348   CO2 24 11/11/2018 1348   GLUCOSE 138 (H) 11/11/2018 1348   BUN 12 11/11/2018 1348   CREATININE 0.84 11/11/2018 1348   CALCIUM 9.1 11/11/2018 1348   GFRNONAA >60 11/11/2018 1348   GFRAA >60 11/11/2018 1348   No results found for: HGBA1C No results found for: INSULIN No results found for: TSH CBC    Component Value Date/Time   WBC 7.1 11/11/2018 1348   RBC 5.51 (H) 11/11/2018 1348   HGB 15.0 11/11/2018 1348   HCT 47.1 (H) 11/11/2018 1348  PLT 176 11/11/2018 1348   MCV 85.5 11/11/2018 1348   MCH 27.2 11/11/2018 1348   MCHC 31.8 11/11/2018 1348   RDW 13.9 11/11/2018 1348   Iron Studies No results found for: IRON, TIBC, FERRITIN, IRONPCTSAT Lipid Panel  No results found for: CHOL, TRIG, HDL, CHOLHDL, VLDL, LDLCALC, LDLDIRECT Hepatic Function Panel     Component Value Date/Time   PROT 6.8 02/11/2015 1100   ALBUMIN 3.5 02/11/2015 1100   AST 19 02/11/2015 1100   ALT 11  (L) 02/11/2015 1100   ALKPHOS 78 02/11/2015 1100   BILITOT 0.2 (L) 02/11/2015 1100   No results found for: TSH Nutritional No results found for: VD25OH   Assessment and Plan Assessment & Plan Morbid obesity due to excess calories Chronic condition with a genetic component. Difficulty maintaining weight loss due to mechanisms like decreased metabolism and increased hunger. Not interested in weight loss surgery. Emphasized understanding obesity as a chronic disease. - Initiate workup to determine mechanisms resisting weight loss. - Implement an eating plan with regular foods to prevent resistance to weight loss. - Provide a grocery list and eating plan (Category 3). - Instruct to consume all assigned food by the end of the day, with flexible timing. - Advise against home weighing to prevent anxiety and emotional eating. - Schedule follow-up appointments to adjust plan based on progress.  Fatigue, multifactorial evaluation Fatigue may be related to obesity mechanisms, vitamin D deficiency, or prediabetes. - Order labs to evaluate potential causes of fatigue and follow  Dyspnea on exertion, under evaluation Possible exercise intolerance related to obesity. Potential for improvement with weight management. Need to rule out other causes. - Order tests to evaluate potential causes of dyspnea and follow  Vitamin D deficiency Potential contributor to fatigue and other symptoms. Discussed the role of vitamin D in overall health. - Order labs to assess current vitamin D levels.  Prediabetes Increased risk of developing type 2 diabetes. Importance of monitoring glucose levels to prevent progression. - Order labs to assess current glucose levels. - Continue diet, exercise and weight loss as discussed today as an important part of the treatment plan   I personally spent a total of 48 minutes in the care of the patient today including preparing to see the patient, getting/reviewing  separately obtained history, performing a medically appropriate exam/evaluation, counseling and educating, placing orders, documenting clinical information in the EHR, and independently interpreting results.    She was informed of the importance of frequent follow up visits to maximize her success with intensive lifestyle modifications for her multiple health conditions.    Louann Penton, MD

## 2024-03-14 DIAGNOSIS — M5416 Radiculopathy, lumbar region: Secondary | ICD-10-CM | POA: Diagnosis not present

## 2024-03-14 DIAGNOSIS — M62830 Muscle spasm of back: Secondary | ICD-10-CM | POA: Diagnosis not present

## 2024-03-15 DIAGNOSIS — R5383 Other fatigue: Secondary | ICD-10-CM | POA: Diagnosis not present

## 2024-03-15 DIAGNOSIS — E559 Vitamin D deficiency, unspecified: Secondary | ICD-10-CM | POA: Diagnosis not present

## 2024-03-15 DIAGNOSIS — R7303 Prediabetes: Secondary | ICD-10-CM | POA: Diagnosis not present

## 2024-03-16 LAB — CBC WITH DIFFERENTIAL/PLATELET
Basophils Absolute: 0 x10E3/uL (ref 0.0–0.2)
Basos: 0 %
EOS (ABSOLUTE): 0 x10E3/uL (ref 0.0–0.4)
Eos: 0 %
Hematocrit: 48.1 % — ABNORMAL HIGH (ref 34.0–46.6)
Hemoglobin: 14.9 g/dL (ref 11.1–15.9)
Immature Grans (Abs): 0.1 x10E3/uL (ref 0.0–0.1)
Immature Granulocytes: 1 %
Lymphocytes Absolute: 0.9 x10E3/uL (ref 0.7–3.1)
Lymphs: 11 %
MCH: 26.8 pg (ref 26.6–33.0)
MCHC: 31 g/dL — ABNORMAL LOW (ref 31.5–35.7)
MCV: 86 fL (ref 79–97)
Monocytes Absolute: 0.2 x10E3/uL (ref 0.1–0.9)
Monocytes: 3 %
Neutrophils Absolute: 7.2 x10E3/uL — ABNORMAL HIGH (ref 1.4–7.0)
Neutrophils: 85 %
Platelets: 217 x10E3/uL (ref 150–450)
RBC: 5.57 x10E6/uL — ABNORMAL HIGH (ref 3.77–5.28)
RDW: 12.9 % (ref 11.7–15.4)
WBC: 8.3 x10E3/uL (ref 3.4–10.8)

## 2024-03-16 LAB — CMP14+EGFR
ALT: 16 IU/L (ref 0–32)
AST: 14 IU/L (ref 0–40)
Albumin: 4.5 g/dL (ref 3.8–4.9)
Alkaline Phosphatase: 99 IU/L (ref 44–121)
BUN/Creatinine Ratio: 21 (ref 9–23)
BUN: 17 mg/dL (ref 6–24)
Bilirubin Total: 0.4 mg/dL (ref 0.0–1.2)
CO2: 21 mmol/L (ref 20–29)
Calcium: 9.8 mg/dL (ref 8.7–10.2)
Chloride: 102 mmol/L (ref 96–106)
Creatinine, Ser: 0.8 mg/dL (ref 0.57–1.00)
Globulin, Total: 3 g/dL (ref 1.5–4.5)
Glucose: 112 mg/dL — ABNORMAL HIGH (ref 70–99)
Potassium: 4.8 mmol/L (ref 3.5–5.2)
Sodium: 139 mmol/L (ref 134–144)
Total Protein: 7.5 g/dL (ref 6.0–8.5)
eGFR: 86 mL/min/1.73 (ref >59)

## 2024-03-16 LAB — T4, FREE: Free T4: 1.35 ng/dL (ref 0.82–1.77)

## 2024-03-16 LAB — T3: T3, Total: 132 ng/dL (ref 71–180)

## 2024-03-16 LAB — CBC

## 2024-03-16 LAB — LIPID PANEL

## 2024-03-16 LAB — HEMOGLOBIN A1C
Est. average glucose Bld gHb Est-mCnc: 111 mg/dL
Hgb A1c MFr Bld: 5.5 % (ref 4.8–5.6)

## 2024-03-16 LAB — LIPID PANEL WITH LDL/HDL RATIO
Cholesterol, Total: 209 mg/dL — ABNORMAL HIGH (ref 100–199)
HDL: 73 mg/dL (ref >39)
LDL Chol Calc (NIH): 124 mg/dL — ABNORMAL HIGH (ref 0–99)
LDL/HDL Ratio: 1.7 ratio (ref 0.0–3.2)
Triglycerides: 65 mg/dL (ref 0–149)
VLDL Cholesterol Cal: 12 mg/dL (ref 5–40)

## 2024-03-16 LAB — BRAIN NATRIURETIC PEPTIDE

## 2024-03-16 LAB — MAGNESIUM: Magnesium: 2.3 mg/dL (ref 1.6–2.3)

## 2024-03-16 LAB — TSH: TSH: 0.583 u[IU]/mL (ref 0.450–4.500)

## 2024-03-16 LAB — VITAMIN B12: Vitamin B-12: 563 pg/mL (ref 232–1245)

## 2024-03-16 LAB — FOLATE: Folate: 10.4 ng/mL (ref >3.0)

## 2024-03-16 LAB — INSULIN, RANDOM: INSULIN: 28.5 u[IU]/mL — ABNORMAL HIGH (ref 2.6–24.9)

## 2024-03-16 LAB — D-DIMER, QUANTITATIVE

## 2024-03-16 LAB — PHOSPHORUS: Phosphorus: 3.1 mg/dL (ref 3.0–4.3)

## 2024-03-16 LAB — VITAMIN D 25 HYDROXY (VIT D DEFICIENCY, FRACTURES): Vit D, 25-Hydroxy: 35.9 ng/mL (ref 30.0–100.0)

## 2024-03-22 ENCOUNTER — Encounter (INDEPENDENT_AMBULATORY_CARE_PROVIDER_SITE_OTHER): Payer: Self-pay | Admitting: Family Medicine

## 2024-03-22 ENCOUNTER — Ambulatory Visit (INDEPENDENT_AMBULATORY_CARE_PROVIDER_SITE_OTHER): Admitting: Family Medicine

## 2024-03-22 VITALS — BP 105/72 | HR 65 | Temp 97.9°F | Ht 66.0 in | Wt 327.0 lb

## 2024-03-22 DIAGNOSIS — Z6841 Body Mass Index (BMI) 40.0 and over, adult: Secondary | ICD-10-CM | POA: Diagnosis not present

## 2024-03-22 DIAGNOSIS — E669 Obesity, unspecified: Secondary | ICD-10-CM

## 2024-03-22 DIAGNOSIS — E559 Vitamin D deficiency, unspecified: Secondary | ICD-10-CM

## 2024-03-22 DIAGNOSIS — M545 Low back pain, unspecified: Secondary | ICD-10-CM

## 2024-03-22 DIAGNOSIS — R7303 Prediabetes: Secondary | ICD-10-CM | POA: Diagnosis not present

## 2024-03-22 DIAGNOSIS — G8929 Other chronic pain: Secondary | ICD-10-CM | POA: Diagnosis not present

## 2024-03-22 DIAGNOSIS — M4316 Spondylolisthesis, lumbar region: Secondary | ICD-10-CM

## 2024-03-22 DIAGNOSIS — E7800 Pure hypercholesterolemia, unspecified: Secondary | ICD-10-CM | POA: Diagnosis not present

## 2024-03-22 MED ORDER — VITAMIN D (ERGOCALCIFEROL) 1.25 MG (50000 UNIT) PO CAPS: 50000.0000 [IU] | ORAL_CAPSULE | ORAL | 0 refills | Status: DC

## 2024-03-22 NOTE — Progress Notes (Signed)
 Office: [phone]  /  Fax: [phone]  WEIGHT SUMMARY AND BIOMETRICS  Anthropometric Measurements Height: 5' 6 (1.676 m) Weight: (!) 327 lb (148.3 kg) BMI (Calculated): 52.8 Weight at Last Visit: 338 lb Weight Lost Since Last Visit: 11 lb Weight Gained Since Last Visit: 0 Starting Weight: 338 lb Total Weight Loss (lbs): 11 lb (4.99 kg) Peak Weight: 400 lb Waist Measurement : 54.5 inches   Body Composition  Body Fat %: 58.1 % Fat Mass (lbs): 190.4 lbs Muscle Mass (lbs): 130.4 lbs Visceral Fat Rating : 23   Other Clinical Data RMR: 2203 Fasting: no Labs: no Today's Visit #: 2 Starting Date: 03/09/24    Chief Complaint: OBESITY    History of Present Illness Elizabeth Mcclure is a 58 year old female who presents for follow-up on her obesity treatment and test results.  She has been adhering to a category two eating plan approximately 75% of the time and has incorporated walking into her routine, walking three days a week for 20 minutes each session. Over the past two weeks, she has lost 11 pounds, despite being on prednisone for eight days due to back pain. She experienced one instance of increased hunger, leading to a meal at [facility], but otherwise has managed her hunger well.  Her recent lab results show a fasting glucose of 112, slightly elevated, but her hemoglobin A1c is normal at 5.5. Her fasting insulin  level is elevated at 28. She has a history of vitamin D  deficiency and will resume supplementation. Her cholesterol levels show an LDL of 124, which is above the target of 100, but her triglycerides and HDL are within normal limits.  She reports significant improvement in her back pain, now rated as a 5 out of 20, compared to the last visit. She is currently taking ibuprofen 800 mg, twice a day, for inflammation and wants to reduce this due to concerns about kidney damage.  She has a history of anemia in 2016 but has not been anemic since. Her recent labs show  slightly elevated hematocrit and RBCs, which have been consistent in past labs. Her neutrophils are slightly elevated. Her thyroid  function tests are normal.  She enjoys being outdoors and has a large collection of jeans in various sizes due to past weight fluctuations. She reports a history of feeling shaky and having blurred vision after consuming oatmeal, which has been a concern in the past.      PHYSICAL EXAM:  Blood pressure 105/72, pulse 65, temperature 97.9 F (36.6 C), height 5' 6 (1.676 m), weight (!) 327 lb (148.3 kg), last menstrual period 10/06/2018, SpO2 98%. Body mass index is 52.78 kg/m.  DIAGNOSTIC DATA REVIEWED:  BMET    Component Value Date/Time   NA 139 03/15/2024 0808   K 4.8 03/15/2024 0808   CL 102 03/15/2024 0808   CO2 21 03/15/2024 0808   GLUCOSE 112 (H) 03/15/2024 0808   GLUCOSE 138 (H) 11/11/2018 1348   BUN 17 03/15/2024 0808   CREATININE 0.80 03/15/2024 0808   CALCIUM 9.8 03/15/2024 0808   GFRNONAA >60 11/11/2018 1348   GFRAA >60 11/11/2018 1348   Lab Results  Component Value Date   HGBA1C 5.5 03/15/2024   HGBA1C CANCELED 03/09/2024   Lab Results  Component Value Date   INSULIN  28.5 (H) 03/15/2024   Lab Results  Component Value Date   TSH 0.583 03/15/2024   CBC    Component Value Date/Time   WBC 8.3 03/15/2024 0808   WBC 7.1  11/11/2018 1348   RBC 5.57 (H) 03/15/2024 0808   RBC 5.51 (H) 11/11/2018 1348   HGB 14.9 03/15/2024 0808   HCT 48.1 (H) 03/15/2024 0808   PLT 217 03/15/2024 0808   MCV 86 03/15/2024 0808   MCH 26.8 03/15/2024 0808   MCH 27.2 11/11/2018 1348   MCHC 31.0 (L) 03/15/2024 0808   MCHC 31.8 11/11/2018 1348   RDW 12.9 03/15/2024 0808   Iron Studies No results found for: IRON, TIBC, FERRITIN, IRONPCTSAT Lipid Panel     Component Value Date/Time   CHOL 209 (H) 03/15/2024 0808   TRIG 65 03/15/2024 0808   HDL 73 03/15/2024 0808   CHOLHDL CANCELED 03/09/2024 0854   LDLCALC 124 (H) 03/15/2024 0808    Hepatic Function Panel     Component Value Date/Time   PROT 7.5 03/15/2024 0808   ALBUMIN 4.5 03/15/2024 0808   AST 14 03/15/2024 0808   ALT 16 03/15/2024 0808   ALKPHOS 99 03/15/2024 0808   BILITOT 0.4 03/15/2024 0808      Component Value Date/Time   TSH 0.583 03/15/2024 0808   Nutritional Lab Results  Component Value Date   VD25OH 35.9 03/15/2024     Assessment and Plan Assessment & Plan Obesity Following a category two eating plan approximately 75% of the time and walking three days a week for 20 minutes. Lost 11 pounds over the last two weeks, with 7 pounds being fat, including a significant reduction in visceral fat. Weight loss exceeded expectations, especially considering prednisone use for back pain. - Continue category two eating plan - Continue walking three days a week for 20 minutes  Prediabetes Fasting glucose slightly elevated at 112 mg/dL, but hemoglobin J8r normal at 5.5%. Fasting insulin  elevated at 28, indicating insulin  resistance. Experiences symptoms of hypoglycemia after consuming simple carbohydrates. Identified as prediabetes, with risk of progressing to diabetes if not managed. Current eating plan effective in managing blood sugar levels and insulin  resistance. - Continue current eating plan to manage blood sugar levels - Educate on the importance of avoiding simple carbohydrates - Recheck labs in three months to monitor progress  Pure hypercholesterolemia LDL cholesterol elevated at 124 mg/dL, while triglycerides and HDL cholesterol are within normal limits. Goal is to reduce LDL cholesterol to below 100 mg/dL through weight loss and dietary changes. - Continue weight loss efforts and appropriate diet to reduce LDL cholesterol  Vitamin D  deficiency Vitamin D  level below desired range, closer to deficiency than optimal. Low milk consumption, which is fortified with vitamin D . - Prescribe weekly vitamin D  supplement - Recheck vitamin D  levels in  three months  Chronic low back pain Chronic low back pain improved from a severity of 20 to 5 since last visit. Was on prednisone for inflammation and currently taking 800 mg of ibuprofen, which she wishes to reduce due to concerns about kidney damage. Pain exacerbated by sitting and relieved by walking. Discussed risks of high-dose ibuprofen, including potential kidney damage, and suggested reducing dose if possible. - Consider reducing ibuprofen dose to 400 mg if possible - Continue walking as tolerated, avoiding downhill walking to prevent strain on the back  Follow-Up Needs to schedule follow-up appointments to monitor progress and adjust treatment plans as necessary. - Schedule follow-up appointments every two weeks for the next six weeks - Ensure appointments are with the provider or nurse practitioner as available     I personally spent a total of 40 minutes in the care of the patient today including preparing to  see the patient, reviewing separately obtained history, documenting clinical information in the EMR, customized nutritional counseling for their specific health and social needs, discussing results with the patient and educating them on how these results can affect their health and weight, and explaining the pathophysiology of obesity and how it is significantly more complex than eat less and exercise more.    Elizabeth Mcclure was informed of the importance of frequent follow up visits to maximize her success with intensive lifestyle modifications for her obesity and obesity related health conditions as recommended by USPSTF and CMS guidelines   Louann Penton, MD

## 2024-04-05 NOTE — Progress Notes (Unsigned)
 SUBJECTIVE: Discussed the use of AI scribe software for clinical note transcription with the patient, who gave verbal consent to proceed.  Chief Complaint: Obesity  Interim History: She has maintained her weight since her last visit.  Down 11 lbs over the past 4 weeks TBW loss of 3.3%  Elizabeth Mcclure is here to discuss her progress with her obesity treatment plan. She is on the Category 2 Plan and states she is following her eating plan approximately 75 % of the time. She states she is exercising stretching 20 minutes 3 times per week- exercise limited by back pain currently. Elizabeth Mcclure is a 58 year old female who presents for follow-up of her obesity treatment plan.  She is compliant with her meal plan at about 75%, incorporating more whole foods like fruits and vegetables, and drinking flavored water  without added sugars. She has eliminated caffeine and drinks one diet soda a day. She has lost 11 pounds since starting the plan a few weeks ago.  She experiences significant back pain starting at the bottom of her spine and radiating down her leg, causing numbness, stinging, and a burning sensation. The pain is severe enough to delay her ability to get out of bed, taking up to 45 minutes on one occasion. She reports that her back doctor told her she has arthritis from the top to the bottom of her spine. Physical activity, such as going up and down stairs and painting, exacerbates the pain. She uses heat patches at night to manage the pain and facilitate getting out of bed in the morning.  She has a history of prediabetes and insulin  resistance. Previously, she experienced morning shakes before eating, which have improved since starting her current meal plan. She has not had the shakes recently and is managing her insulin  resistance through dietary changes.  She has a history of hypercholesterolemia and vitamin D  deficiency, though no current symptoms or treatments related to these conditions were  discussed during this visit.  She has a history of nonobstructive coronary artery disease with a coronary calcium score of 175, with no discussion of current symptoms or treatments related to this condition during the visit.  She feels tired, attributing it to her back pain. She wakes up in the middle of the night due to pain and experiences cramps that curl her toes. No excessive hunger when eating on plan, and she does not eat past 7 or 8 PM.  OBJECTIVE: Visit Diagnoses: Problem List Items Addressed This Visit     Degenerative spondylolisthesis   Morbid obesity (HCC)   Other Visit Diagnoses       Prediabetes    -  Primary     Pure hypercholesterolemia         Coronary artery disease due to lipid rich plaque- Coronary Ca++ score of 175- non obstructive disease         Vitamin D  deficiency         BMI 50.0-59.9, adult (HCC) Current BMI 52.8         Obesity with insulin  resistance and prediabetes Obesity with insulin  resistance. Current A1c is not in the prediabetic range, but insulin  resistance is evident with an insulin  level of 28.5. She is following a nutritional plan with approximately 75% compliance and has lost 11 pounds since starting the plan. She did ask about injectable medication for weight loss, but is losing weight without currently and we discussed that they are not typically covered by insurance. Encouraged her to consider metformin  for primary indication of insulin  resistance, but she is hesitant to take metformin.  - Continue current nutritional plan with focus on whole foods and portion control - Consider metformin in future visits to address insulin  resistance- provided hand out information and will revisit at next appt.  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 Pain radiates from the bottom of the spine down the leg, causing numbness, stinging,  and burning sensations. Pain exacerbated by physical activity and stress. She is seeing a back specialist, Dr. Malcolm, for further evaluation and management. - Continue using heat patches at night for pain relief - Follow up with Dr. Malcolm for back pain management    Prediabetes Last A1c was 5.5- reports she thinks A1c was higher with PCP in past- In prediabetic range. Insulin  28.5- not at goal  Medication(s): None Polyphagia:No- not when eating on plan Reports hunger well controlled overall. Some rare cravings for something sweet, but not excessive.  Lab Results  Component Value Date   HGBA1C 5.5 03/15/2024   HGBA1C CANCELED 03/09/2024   Lab Results  Component Value Date   INSULIN  28.5 (H) 03/15/2024    Plan:  Continue working on nutrition plan to decrease simple carbohydrates, increase lean proteins and exercise to promote weight loss, improve glycemic control and prevent progression to Type 2 diabetes.  Consider metformin for insulin  resistance, but she is hesitant at this point. See notes below.  Provided hand out on insulin  resistance and metformin use.    Hyperlipidemia/CC score of 175 LDL is not at goal. Medication(s): None Cardiovascular risk factors: dyslipidemia, family history of premature cardiovascular disease, obesity (BMI >= 30 kg/m2), and sedentary lifestyle  Lab Results  Component Value Date   CHOL 209 (H) 03/15/2024   HDL 73 03/15/2024   LDLCALC 124 (H) 03/15/2024   TRIG 65 03/15/2024   CHOLHDL CANCELED 03/09/2024   Lab Results  Component Value Date   ALT 16 03/15/2024   AST 14 03/15/2024   ALKPHOS 99 03/15/2024   BILITOT 0.4 03/15/2024   The 10-year ASCVD risk score (Arnett DK, et al., 2019) is: 2%   Values used to calculate the score:     Age: 63 years     Clincally relevant sex: Female     Is Non-Hispanic African American: No     Diabetic: No     Tobacco smoker: No     Systolic Blood Pressure: 127 mmHg     Is BP treated: No     HDL  Cholesterol: 73 mg/dL     Total Cholesterol: 209 mg/dL  Plan: Risk score low, but does have hx of elevated CCScore. May want to consider statin therapy if lipid profile not improved with current plan Continue working on nutrition plan to promote weight loss and improve lipid profile/decrease CV risk.   General Health Maintenance She is making lifestyle changes to improve overall health, including dietary modifications and weight loss efforts. Discussed the importance of continuing these changes to prevent progression to type 2 diabetes. - Continue lifestyle modifications including dietary changes and weight management   Vitamin D  Deficiency Vitamin D  is not at goal of 50.  Most recent vitamin D  level was 35.9. She is on  prescription ergocalciferol  50,000 IU weekly. No N/V or muscle weakness with ERgo Lab Results  Component Value Date   VD25OH 35.9 03/15/2024    Plan: Continue  prescription ergocalciferol  50,000 IU  weekly- no refill needed this visit.  Low vitamin D  levels can be associated with adiposity and may result in leptin resistance and weight gain. Also associated with fatigue.  Currently on vitamin D  supplementation without any adverse effects such as nausea, vomiting or muscle weakness.   Vitals Temp: 98.3 F (36.8 C) BP: 127/84 Pulse Rate: 72 SpO2: 100 %   Anthropometric Measurements Height: 5' 6 (1.676 m) Weight: (!) 327 lb (148.3 kg) BMI (Calculated): 52.8 Weight at Last Visit: 327 lb Weight Lost Since Last Visit: 0 Weight Gained Since Last Visit: 0 Starting Weight: 338 lb Total Weight Loss (lbs): 11 lb (4.99 kg) Peak Weight: 400 lb   Body Composition  Body Fat %: 58.3 % Fat Mass (lbs): 190.8 lbs Muscle Mass (lbs): 129.8 lbs Visceral Fat Rating : 23   Other Clinical Data Fasting: No Labs: No Today's Visit #: 3 Starting Date: 03/09/24     ASSESSMENT AND PLAN:  Diet: Walida is currently in the action stage of change. As such, her goal is to  continue with weight loss efforts. She has agreed to Category 2 Plan.  Exercise: Jakyria has been instructed to try a geriatric exercise plan - like chair yoga or other once you can tolerate more activity/back pain improves-for weight loss and overall health benefits.   Behavior Modification:  We discussed the following Behavioral Modification Strategies today: increasing lean protein intake, decreasing simple carbohydrates, increasing vegetables, increase H2O intake, meal planning and cooking strategies, avoiding temptations, and planning for success. We discussed various medication options to help [place] with her weight loss efforts and we both agreed to continue current treatment plan.   We discussed use of metformin for insulin  resistance today and provided hand outs concerning both. She is hesitant to start metformin due to a clients experience with it and concerns of kidney injury risk at this point. She did ask about injectables for weight loss today and we discussed they can be a valuable tool, but she is already making good progress with lifestyle changes and the medication is not typically covered by her insurance. Discussed could consider other medications specifically for weight loss as well and will discuss further at the next visit.   Return in about 2 weeks (around 04/20/2024).SABRA She was informed of the importance of frequent follow up visits to maximize her success with intensive lifestyle modifications for her multiple health conditions.  Attestation Statements:   Reviewed by clinician on day of visit: allergies, medications, problem list, medical history, surgical history, family history, social history, and previous encounter notes.   Time spent on visit including pre-visit chart review and post-visit care and charting was 43 minutes.    Zoey Gilkeson, PA-C

## 2024-04-06 ENCOUNTER — Ambulatory Visit (INDEPENDENT_AMBULATORY_CARE_PROVIDER_SITE_OTHER): Admitting: Physician Assistant

## 2024-04-06 ENCOUNTER — Encounter (INDEPENDENT_AMBULATORY_CARE_PROVIDER_SITE_OTHER): Payer: Self-pay | Admitting: Physician Assistant

## 2024-04-06 VITALS — BP 127/84 | HR 72 | Temp 98.3°F | Ht 66.0 in | Wt 327.0 lb

## 2024-04-06 DIAGNOSIS — E7800 Pure hypercholesterolemia, unspecified: Secondary | ICD-10-CM | POA: Diagnosis not present

## 2024-04-06 DIAGNOSIS — R7303 Prediabetes: Secondary | ICD-10-CM | POA: Diagnosis not present

## 2024-04-06 DIAGNOSIS — M4316 Spondylolisthesis, lumbar region: Secondary | ICD-10-CM | POA: Diagnosis not present

## 2024-04-06 DIAGNOSIS — E559 Vitamin D deficiency, unspecified: Secondary | ICD-10-CM | POA: Diagnosis not present

## 2024-04-06 DIAGNOSIS — M4310 Spondylolisthesis, site unspecified: Secondary | ICD-10-CM

## 2024-04-06 DIAGNOSIS — I2510 Atherosclerotic heart disease of native coronary artery without angina pectoris: Secondary | ICD-10-CM

## 2024-04-06 DIAGNOSIS — Z6841 Body Mass Index (BMI) 40.0 and over, adult: Secondary | ICD-10-CM

## 2024-04-06 DIAGNOSIS — Z87891 Personal history of nicotine dependence: Secondary | ICD-10-CM

## 2024-04-06 DIAGNOSIS — I2583 Coronary atherosclerosis due to lipid rich plaque: Secondary | ICD-10-CM | POA: Diagnosis not present

## 2024-04-07 ENCOUNTER — Other Ambulatory Visit (HOSPITAL_COMMUNITY): Payer: Self-pay | Admitting: Neurosurgery

## 2024-04-07 DIAGNOSIS — M4316 Spondylolisthesis, lumbar region: Secondary | ICD-10-CM

## 2024-04-12 DIAGNOSIS — E559 Vitamin D deficiency, unspecified: Secondary | ICD-10-CM | POA: Diagnosis not present

## 2024-04-12 DIAGNOSIS — F419 Anxiety disorder, unspecified: Secondary | ICD-10-CM | POA: Diagnosis not present

## 2024-04-12 DIAGNOSIS — Z1339 Encounter for screening examination for other mental health and behavioral disorders: Secondary | ICD-10-CM | POA: Diagnosis not present

## 2024-04-12 DIAGNOSIS — R5383 Other fatigue: Secondary | ICD-10-CM | POA: Diagnosis not present

## 2024-04-12 DIAGNOSIS — Z79899 Other long term (current) drug therapy: Secondary | ICD-10-CM | POA: Diagnosis not present

## 2024-04-12 DIAGNOSIS — Z7189 Other specified counseling: Secondary | ICD-10-CM | POA: Diagnosis not present

## 2024-04-12 DIAGNOSIS — Z299 Encounter for prophylactic measures, unspecified: Secondary | ICD-10-CM | POA: Diagnosis not present

## 2024-04-12 DIAGNOSIS — Z1331 Encounter for screening for depression: Secondary | ICD-10-CM | POA: Diagnosis not present

## 2024-04-12 DIAGNOSIS — Z0000 Encounter for general adult medical examination without abnormal findings: Secondary | ICD-10-CM | POA: Diagnosis not present

## 2024-04-13 DIAGNOSIS — E559 Vitamin D deficiency, unspecified: Secondary | ICD-10-CM | POA: Diagnosis not present

## 2024-04-13 DIAGNOSIS — F419 Anxiety disorder, unspecified: Secondary | ICD-10-CM | POA: Diagnosis not present

## 2024-04-13 DIAGNOSIS — Z79899 Other long term (current) drug therapy: Secondary | ICD-10-CM | POA: Diagnosis not present

## 2024-04-13 DIAGNOSIS — R5383 Other fatigue: Secondary | ICD-10-CM | POA: Diagnosis not present

## 2024-04-13 DIAGNOSIS — D509 Iron deficiency anemia, unspecified: Secondary | ICD-10-CM | POA: Diagnosis not present

## 2024-04-14 ENCOUNTER — Other Ambulatory Visit: Payer: Self-pay | Admitting: Neurosurgery

## 2024-04-14 DIAGNOSIS — M4316 Spondylolisthesis, lumbar region: Secondary | ICD-10-CM

## 2024-04-20 ENCOUNTER — Ambulatory Visit (INDEPENDENT_AMBULATORY_CARE_PROVIDER_SITE_OTHER): Admitting: Physician Assistant

## 2024-04-20 ENCOUNTER — Encounter (INDEPENDENT_AMBULATORY_CARE_PROVIDER_SITE_OTHER): Payer: Self-pay | Admitting: Physician Assistant

## 2024-04-20 VITALS — BP 121/85 | HR 66 | Temp 98.0°F | Ht 66.0 in | Wt 326.0 lb

## 2024-04-20 DIAGNOSIS — M4310 Spondylolisthesis, site unspecified: Secondary | ICD-10-CM

## 2024-04-20 DIAGNOSIS — R7303 Prediabetes: Secondary | ICD-10-CM | POA: Diagnosis not present

## 2024-04-20 DIAGNOSIS — I2583 Coronary atherosclerosis due to lipid rich plaque: Secondary | ICD-10-CM

## 2024-04-20 DIAGNOSIS — E7800 Pure hypercholesterolemia, unspecified: Secondary | ICD-10-CM

## 2024-04-20 DIAGNOSIS — E559 Vitamin D deficiency, unspecified: Secondary | ICD-10-CM

## 2024-04-20 DIAGNOSIS — Z6841 Body Mass Index (BMI) 40.0 and over, adult: Secondary | ICD-10-CM

## 2024-04-20 DIAGNOSIS — I2510 Atherosclerotic heart disease of native coronary artery without angina pectoris: Secondary | ICD-10-CM

## 2024-04-20 MED ORDER — WEGOVY 0.25 MG/0.5ML ~~LOC~~ SOAJ: 0.2500 mg | SUBCUTANEOUS | 0 refills | Status: DC

## 2024-04-20 MED ORDER — VITAMIN D (ERGOCALCIFEROL) 1.25 MG (50000 UNIT) PO CAPS: 50000.0000 [IU] | ORAL_CAPSULE | ORAL | 0 refills | Status: DC

## 2024-04-20 NOTE — Progress Notes (Unsigned)
 SUBJECTIVE: Discussed the use of AI scribe software for clinical note transcription with the patient, who gave verbal consent to proceed.  Chief Complaint: Obesity  Interim History: She is down 1 lb since her last visit.  Down 12 lbs overall   Elizabeth Mcclure is here to discuss her progress with her obesity treatment plan. She is on the Category 2 Plan and states she is following her eating plan approximately 90 % of the time. She states she is not exercising .  Elizabeth Mcclure is a 58 year old female with obesity who presents for follow-up on her obesity treatment plan.  She is experiencing challenges with maintaining weight loss due to cravings and social eating situations. She consumed a pecan pancake with sugar-free syrup and a chicken cheesesteak, which caused gastrointestinal discomfort, likely due to grease. Despite these challenges, she has lost an additional pound over the past two weeks. Her current diet includes avoiding greasy and fast foods, focusing on protein intake and hydration.  She has a history of prediabetes and hyperlipidemia. She is concerned about her cardiovascular health due to suspected coronary artery disease with a coronary calcium score of 175. She is interested in preventive measures to avoid heart attacks.  She experiences significant back and leg pain, attributed to degenerative spondylolisthesis and arthritis. The pain radiates from her foot up her leg, described as 'electric shocks' and burning sensations, limiting her mobility and making it difficult to maintain physical activity. She uses pain patches and Advil at night to manage her symptoms, which allows her to sleep better. She works two consecutive days a week, which exacerbates her leg pain, causing it to buckle and burn. She rests on the following days to recover. She is scheduled for an MRI in early November to further investigate her back issues.  Her family history includes degenerative disc disease and  cancer. No personal history of thyroid  cancer, pancreatitis, or multiple endocrine neoplasia syndrome type two. No bowel or bladder dysfunction. OBJECTIVE: Visit Diagnoses: Problem List Items Addressed This Visit     Degenerative spondylolisthesis   Relevant Medications   semaglutide-weight management (WEGOVY) 0.25 MG/0.5ML SOAJ SQ injection   Morbid obesity (HCC)   Relevant Medications   semaglutide-weight management (WEGOVY) 0.25 MG/0.5ML SOAJ SQ injection   Other Visit Diagnoses       Prediabetes    -  Primary   Relevant Medications   semaglutide-weight management (WEGOVY) 0.25 MG/0.5ML SOAJ SQ injection     Pure hypercholesterolemia       Relevant Medications   semaglutide-weight management (WEGOVY) 0.25 MG/0.5ML SOAJ SQ injection     Coronary artery disease due to lipid rich plaque- Coronary Ca++ score of 175- non obstructive disease       Relevant Medications   semaglutide-weight management (WEGOVY) 0.25 MG/0.5ML SOAJ SQ injection     Vitamin D  deficiency       Relevant Medications   Vitamin D , Ergocalciferol , (DRISDOL ) 1.25 MG (50000 UNIT) CAPS capsule     BMI 50.0-59.9, adult (HCC) Current BMI 52.7       Relevant Medications   semaglutide-weight management (WEGOVY) 0.25 MG/0.5ML SOAJ SQ injection     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 She is hesitant about surgery and prefers weight loss to alleviate pressure. Current management includes pain patches and Advil for symptom relief. - Proceed with scheduled MRI  - Continue use of pain patches and Advil for symptom management -  Encourage weight loss to reduce pressure on spine  Obesity Obesity management is ongoing with gradual weight loss. Challenges include cravings and limited mobility due to back and leg pain. Discussed potential use of Wegovy for weight loss, but insurance coverage is  uncertain. Phentermine is not recommended due to potential for increased anxiety and pain. Metformin is considered as an alternative if Georjean is not approved, with monitoring of kidney function due to past concerns with similar medications in family history. - Submit prior authorization for [facility] based on coronary artery disease and prediabetes - Continue current weight management plan focusing on protein intake and hydration - Consider metformin if Georjean is not approved - Encourage physical activity as tolerated  Suspected non-obstructive coronary artery disease Suspected non-obstructive coronary artery disease with a coronary calcium score of 175. Discussed potential use of Wegovy for weight management, which may also benefit cardiac health. Insurance coverage for Georjean is uncertain. - Submit prior authorization for [facility] based on coronary artery disease and prediabetes  Prediabetes Discussed potential use of Wegovy, which may help with insulin  resistance. Metformin is considered as an alternative if Georjean is not approved. - Submit prior authorization for [facility] based on coronary artery disease and prediabetes - Consider metformin if Georjean is not approved  Hyperlipidemia Last lipids Lab Results  Component Value Date   CHOL 209 (H) 03/15/2024   HDL 73 03/15/2024   LDLCALC 124 (H) 03/15/2024   TRIG 65 03/15/2024   CHOLHDL CANCELED 03/09/2024   LDL not at goal HDL at goal. Trig at goal May benefit from [facility] for hyperlipidemia, Plan: Continue to work on Engineer, technical sales -decreasing simple carbohydrates, increasing lean proteins, decreasing saturated fats and cholesterol , avoiding trans fats and exercise as able to promote weight loss, improve lipids and decrease cardiovascular risks.   Vitamin D  deficiency Vitamin D  deficiency is being managed with supplementation. Continued supplementation is recommended, especially during winter months.Ergocalciferol  50,000 units once  weekly Last vitamin D  Lab Results  Component Value Date   VD25OH 35.9 03/15/2024   Continue Ergocalciferol  50,000 units once weekly.  Low vitamin D  levels can be associated with adiposity and may result in leptin resistance and weight gain. Also associated with fatigue.  Currently on vitamin D  supplementation without any adverse effects such as nausea, vomiting or muscle weakness.  Meds ordered this encounter  Medications   DISCONTD: semaglutide-weight management (WEGOVY) 0.25 MG/0.5ML SOAJ SQ injection    Sig: Inject 0.25 mg into the skin once a week.    Dispense:  2 mL    Refill:  0   semaglutide-weight management (WEGOVY) 0.25 MG/0.5ML SOAJ SQ injection    Sig: Inject 0.25 mg into the skin once a week.    Dispense:  2 mL    Refill:  0   Vitamin D , Ergocalciferol , (DRISDOL ) 1.25 MG (50000 UNIT) CAPS capsule    Sig: Take 1 capsule (50,000 Units total) by mouth every 7 (seven) days.    Dispense:  4 capsule    Refill:  0   - Continue vitamin D  supplementation through winter months - Send vitamin D  prescription [facility]  Insomnia, intermittent Intermittent insomnia is managed with a consistent sleep schedule. Pain patches and Advil are used to aid sleep due to leg pain. - Continue consistent sleep schedule - Use pain patches and Advil as needed for slep  Vitals Temp: 98 F (36.7 C) BP: 121/85 Pulse Rate: 66 SpO2: 100 %   Anthropometric Measurements Height: 5' 6 (1.676 m) Weight: ROLLEN)  326 lb (147.9 kg) BMI (Calculated): 52.64 Weight at Last Visit: 327 lb Weight Lost Since Last Visit: 1 lb Weight Gained Since Last Visit: 0 Starting Weight: 338 lb Total Weight Loss (lbs): 12 lb (5.443 kg) Peak Weight: 400 lb   Body Composition  Body Fat %: 58.4 % Fat Mass (lbs): 190.8 lbs Muscle Mass (lbs): 129.2 lbs Visceral Fat Rating : 23   Other Clinical Data Fasting: No Labs: No Today's Visit #: 4 Starting Date: 03/09/24     ASSESSMENT AND  PLAN:  Diet: Leaann is currently in the action stage of change. As such, her goal is to continue with weight loss efforts. She has agreed to Category 2 Plan.  Exercise: Avyn has been instructed that some exercise is better than none and exercise if able with current issues with severe pain for weight loss and overall health benefits.   Behavior Modification:  We discussed the following Behavioral Modification Strategies today: increasing lean protein intake, decreasing simple carbohydrates, increasing vegetables, increase H2O intake, increase high fiber foods, meal planning and cooking strategies, emotional eating strategies , avoiding temptations, and planning for success. We discussed various medication options to help Edana with her weight loss efforts and we both agreed to continue current treatment plan and start Wegovy if approved for primary indication of CAD with elevated coronary calcium score and hyperlipidemia as well as prediabetes.  Return in about 2 weeks (around 05/04/2024).SABRA She was informed of the importance of frequent follow up visits to maximize her success with intensive lifestyle modifications for her multiple health conditions.  Attestation Statements:   Reviewed by clinician on day of visit: allergies, medications, problem list, medical history, surgical history, family history, social history, and previous encounter notes.   Time spent on visit including pre-visit chart review and post-visit care and charting was 31 minutes.    Liem Copenhaver, PA-C

## 2024-04-21 ENCOUNTER — Telehealth (INDEPENDENT_AMBULATORY_CARE_PROVIDER_SITE_OTHER): Payer: Self-pay | Admitting: Physician Assistant

## 2024-04-21 NOTE — Telephone Encounter (Signed)
 10/16 pt called and said her insurance [employer] and [employer] Elisa needs to be appealed . (Pt is not savvy with Mychart) please contact her on phone listed

## 2024-04-25 NOTE — Telephone Encounter (Signed)
 Left detailed message on patient's voicemail regarding appeal process.

## 2024-04-26 ENCOUNTER — Ambulatory Visit: Admitting: Podiatry

## 2024-04-26 DIAGNOSIS — D2372 Other benign neoplasm of skin of left lower limb, including hip: Secondary | ICD-10-CM

## 2024-04-26 DIAGNOSIS — M722 Plantar fascial fibromatosis: Secondary | ICD-10-CM

## 2024-04-26 NOTE — Progress Notes (Signed)
 She presents today with another patient Tod Iba asking if we could squeeze her in to give her an injection to her left heel.  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  Objective: Vital signs are stable alert and oriented x 3.  Pulses are palpable.  She has severe pain on palpation MucoClear tubercle of her left heel.  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  Assessment: Benign neoplasm plantar aspect left foot and plantar fasciitis left foot.  Plan: At this point I injected her left heel today 20 mg Kenalog  pentagrams Marcaine  for maximal tenderness.  Tolerated procedure well without complications was given both oral and written home-going instructions.  I also debrided the benign skin lesion plantar aspect of the foot discussed the possibility of derotating the fifth toe.  Follow-up with her as needed.

## 2024-04-30 ENCOUNTER — Ambulatory Visit
Admission: RE | Admit: 2024-04-30 | Discharge: 2024-04-30 | Disposition: A | Discharge status: Home / Self Care | Source: Ambulatory Visit | Attending: Neurosurgery | Admitting: Neurosurgery

## 2024-04-30 DIAGNOSIS — M5116 Intervertebral disc disorders with radiculopathy, lumbar region: Secondary | ICD-10-CM | POA: Diagnosis not present

## 2024-04-30 DIAGNOSIS — M4726 Other spondylosis with radiculopathy, lumbar region: Secondary | ICD-10-CM | POA: Diagnosis not present

## 2024-04-30 DIAGNOSIS — M4316 Spondylolisthesis, lumbar region: Secondary | ICD-10-CM

## 2024-04-30 DIAGNOSIS — M48061 Spinal stenosis, lumbar region without neurogenic claudication: Secondary | ICD-10-CM | POA: Diagnosis not present

## 2024-05-03 NOTE — Telephone Encounter (Signed)
 Patient never returned call

## 2024-05-04 ENCOUNTER — Encounter (INDEPENDENT_AMBULATORY_CARE_PROVIDER_SITE_OTHER): Payer: Self-pay | Admitting: Physician Assistant

## 2024-05-04 ENCOUNTER — Ambulatory Visit (INDEPENDENT_AMBULATORY_CARE_PROVIDER_SITE_OTHER): Admitting: Physician Assistant

## 2024-05-04 VITALS — BP 135/78 | HR 75 | Temp 97.8°F | Ht 66.0 in | Wt 320.0 lb

## 2024-05-04 DIAGNOSIS — M79606 Pain in leg, unspecified: Secondary | ICD-10-CM

## 2024-05-04 DIAGNOSIS — M549 Dorsalgia, unspecified: Secondary | ICD-10-CM | POA: Diagnosis not present

## 2024-05-04 DIAGNOSIS — I2510 Atherosclerotic heart disease of native coronary artery without angina pectoris: Secondary | ICD-10-CM | POA: Diagnosis not present

## 2024-05-04 DIAGNOSIS — R7303 Prediabetes: Secondary | ICD-10-CM

## 2024-05-04 DIAGNOSIS — M4310 Spondylolisthesis, site unspecified: Secondary | ICD-10-CM | POA: Diagnosis not present

## 2024-05-04 DIAGNOSIS — I2583 Coronary atherosclerosis due to lipid rich plaque: Secondary | ICD-10-CM

## 2024-05-04 DIAGNOSIS — E7800 Pure hypercholesterolemia, unspecified: Secondary | ICD-10-CM | POA: Diagnosis not present

## 2024-05-04 DIAGNOSIS — Z6841 Body Mass Index (BMI) 40.0 and over, adult: Secondary | ICD-10-CM

## 2024-05-04 DIAGNOSIS — G8929 Other chronic pain: Secondary | ICD-10-CM

## 2024-05-04 DIAGNOSIS — E88819 Insulin resistance, unspecified: Secondary | ICD-10-CM

## 2024-05-04 MED ORDER — METFORMIN HCL 500 MG PO TABS: 500.0000 mg | ORAL_TABLET | Freq: Every day | ORAL | 0 refills | Status: DC

## 2024-05-04 NOTE — Progress Notes (Addendum)
 SUBJECTIVE: Discussed the use of AI scribe software for clinical note transcription with the patient, who gave verbal consent to proceed.  Chief Complaint: Obesity  Interim History: She is down 6 lbs since her last visit.  Down 18 lbs overall TBW loss of 5.3%  Rachana is here to discuss her progress with her obesity treatment plan. She is on the Category 2 Plan and states she is following her eating plan approximately 90 % of the time. She states she is exercising walking 20 minutes 2 times per week. JANERA PEUGH is a 58 year old female with obesity who presents for follow-up of her obesity treatment plan.  She is adhering to a category two obesity treatment plan approximately ninety percent of the time, resulting in a total weight loss of eighteen pounds, with six pounds lost since her last visit.  Her diet includes more whole foods, adequate protein, and sufficient water  intake, and she avoids skipping meals. She sleeps seven to nine hours nightly and engages in walking and stretching for at least twenty minutes twice a week. Her mobility is limited by back issues.  She has a history of degenerative spondylolisthesis, with back pain that is somewhat improved but still requires management with Advil liquid gels before bed. Without this, she experiences significant burning, tingling, and stabbing pain in her hip and leg, and she thinks it may be due to some kind of nerve damage or neuropathy. An MRI showed no new findings beyond her preexisting condition. Previous injections for back pain were ineffective, and she currently uses home therapy exercises and salon pads at night for relief.  She has a history of prediabetes and hypercholesterolemia. She was previously prescribed [facility] for cardiovascular risk reduction but has been unable to obtain it due to insurance issues. She is considering starting metformin as previously discussed for prediabetes( off label use), weight management and  metabolic health.  She experiences stress-related eating tendencies and has used Xanax  in the past for anxiety, recently obtaining a small amount from her sister to manage stress without resorting to unhealthy eating. She is mindful of her diet, occasionally indulging in small treats but generally adhering to her plan. She prefers savory foods over sweets, with a preference for bread, potatoes, and chips, and has found healthier alternatives like baked chips and veggie puffs to satisfy these cravings.  Pharmacotherapy: Georjean- not covered by insurance.   TO START metformin off label use for primary indication of prediabetes.  OBJECTIVE: Visit Diagnoses: Problem List Items Addressed This Visit     Degenerative spondylolisthesis   Relevant Orders   Ambulatory referral to Physical Medicine Rehab   Morbid obesity (HCC)   Relevant Medications   metFORMIN (GLUCOPHAGE) 500 MG tablet   Other Visit Diagnoses       Prediabetes    -  Primary   Relevant Medications   metFORMIN (GLUCOPHAGE) 500 MG tablet     Pure hypercholesterolemia         Coronary artery disease due to lipid rich plaque- Coronary Ca++ score of 175- non obstructive disease         Vitamin D  deficiency         BMI 50.0-59.9, adult (HCC) Current BMI 51.8       Relevant Medications   metFORMIN (GLUCOPHAGE) 500 MG tablet     Obesity  Obesity with a total weight loss of eighteen pounds since starting the treatment plan. Following a category two plan approximately ninety percent of the time. Engaging  in physical activity despite limitations due to back issues. Insurance issues with Georjean; appeal process discussed.  Considering metformin as an alternative to aid weight loss and improve metabolism. Discussed that metformin can be stopped abruptly if not tolerated and may provide a modest amount of weight loss and drive metabolism in a favorable direction. - Initiate metformin 500 mg daily with food, consider starting with half a  tablet (250 mg) to assess tolerance. - Encourage continued adherence to the category two plan and physical activity as tolerated. - Discussed the appeal process for [facility], noting that the appeal must be initiated by her.  Prediabetes/Insulin  resistance Prediabetes with consideration of metformin to aid in weight loss and improve metabolic profile. Discussed potential benefits of metformin in driving metabolism in a favorable direction and promoting adipose loss over muscle loss. We discussed starting metformin, potential risks, benefits and possible side effects and she is agreeable to begin metformin at this time.   Lab Results  Component Value Date   HGBA1C 5.5 03/15/2024   HGBA1C CANCELED 03/09/2024   Lab Results  Component Value Date   LDLCALC 124 (H) 03/15/2024   CREATININE 0.80 03/15/2024   Her HOMA-IR is 7.9 which is elevated. Optimal level < 1.9. This is complex condition associated with genetics, ectopic fat and lifestyle factors. Insulin  resistance may result in weight gain, abnormal cravings (particularly for carbs) and fatigue. This may result in additional weight gain and lead to pre-diabetes and diabetes if untreated.   Lab Results  Component Value Date   HGBA1C 5.5 03/15/2024   Lab Results  Component Value Date   INSULIN  28.5 (H) 03/15/2024   Lab Results  Component Value Date   GLUCOSE 112 (H) 03/15/2024   GLUCOSE 86 01/12/2015   We reviewed treatment options which include losing 7 to 10% of body weight, increasing physical activity to a 150 minutes a week of moderate intensity.She may also be a candidate for pharmacoprophylaxis with metformin or incretin mimetic.  Plan:  - Initiate metformin 500 mg daily with food, consider starting with half a tablet (250 mg) to assess tolerance. Continue working on nutrition plan to decrease simple carbohydrates, increase lean proteins and exercise to promote weight loss, improve glycemic control and prevent progression to Type  2 diabetes.    Degenerative spondylolisthesis of spine with chronic back and leg pain Chronic back and leg pain due to degenerative spondylolisthesis. MRI shows no new findings. Reports some relief with Advil and salonpas patches.   Not interested in surgery and has had limited success with injections in the past. Discussed referral to physical medicine and rehabilitation (PM&R) for non-surgical management options.  - Refer to physical medicine and rehabilitation for further evaluation and management. - Continue current pain management with Advil and salon pads as needed. - Encourage continuation of home therapy exercises.  Pure hypercholesterolemia LDL 124- not at goal. HDL 73- at goal. Trig 65- at goal.  Atherosclerotic heart disease with coronary calcium score of 175. Has clinical indications for treatment with Wegovy, but insurance will not cover.  The 10-year ASCVD risk score (Arnett DK, et al., 2019) is: 2.2%   Values used to calculate the score:     Age: 21 years     Clincally relevant sex: Female     Is Non-Hispanic African American: No     Diabetic: No     Tobacco smoker: No     Systolic Blood Pressure: 135 mmHg     Is BP treated: No  HDL Cholesterol: 73 mg/dL     Total Cholesterol: 209 mg/dL  Plan:  Statin not indicated by risk score currently, but elevated coronary calcium score, so may want to start statin if weight loss does not improve panel.  Continue to work on nutrition plan -decreasing simple carbohydrates, increasing lean proteins, decreasing saturated fats and cholesterol , avoiding trans fats and exercise as able to promote weight loss, improve lipids and decrease cardiovascular risks. Continue to monitor lipids with continued weight loss.     Vitals Temp: 97.8 F (36.6 C) BP: 135/78 Pulse Rate: 75 SpO2: 92 %   Anthropometric Measurements Height: 5' 6 (1.676 m) Weight: (!) 320 lb (145.2 kg) BMI (Calculated): 51.67 Weight at Last Visit: 326  lb Weight Lost Since Last Visit: 6 lb Weight Gained Since Last Visit: 0 Starting Weight: 338 lb Total Weight Loss (lbs): 18 lb (8.165 kg) Peak Weight: 400 lb   Body Composition  Body Fat %: 55.9 % Fat Mass (lbs): 179.4 lbs Muscle Mass (lbs): 134.2 lbs Total Body Water  (lbs): 103.8 lbs Visceral Fat Rating : 22   Other Clinical Data Fasting: No Labs: No Today's Visit #: 5 Starting Date: 03/09/24     ASSESSMENT AND PLAN:  Diet: Grasiela is currently in the action stage of change. As such, her goal is to continue with weight loss efforts. She has agreed to Category 2 Plan.  Exercise: Encarnacion has been instructed to work up to a goal of 150 minutes of combined cardio and strengthening exercise per week, to try a geriatric exercise plan, and that some exercise is better than none for weight loss and overall health benefits.   Behavior Modification:  We discussed the following Behavioral Modification Strategies today: increasing lean protein intake, decreasing simple carbohydrates, increasing vegetables, increase H2O intake, increase high fiber foods, meal planning and cooking strategies, avoiding temptations, and planning for success. We discussed various medication options to help Arleatha with her weight loss efforts and we both agreed to start metformin for primary indication of prediabetes and insulin  resistance (off label use).  Return in about 2 weeks (around 05/18/2024).SABRA She was informed of the importance of frequent follow up visits to maximize her success with intensive lifestyle modifications for her multiple health conditions.  Attestation Statements:   Reviewed by clinician on day of visit: allergies, medications, problem list, medical history, surgical history, family history, social history, and previous encounter notes.   Time spent on visit including pre-visit chart review and post-visit care and charting was 38 minutes.    Vernard Gram, PA-C

## 2024-05-11 ENCOUNTER — Encounter: Payer: Self-pay | Admitting: Physical Medicine & Rehabilitation

## 2024-05-17 ENCOUNTER — Encounter (INDEPENDENT_AMBULATORY_CARE_PROVIDER_SITE_OTHER): Payer: Self-pay | Admitting: Physician Assistant

## 2024-05-17 ENCOUNTER — Ambulatory Visit (INDEPENDENT_AMBULATORY_CARE_PROVIDER_SITE_OTHER): Payer: Self-pay | Admitting: Physician Assistant

## 2024-05-17 VITALS — BP 118/81 | HR 68 | Temp 97.8°F | Ht 66.0 in | Wt 320.0 lb

## 2024-05-17 DIAGNOSIS — E88819 Insulin resistance, unspecified: Secondary | ICD-10-CM

## 2024-05-17 DIAGNOSIS — M4310 Spondylolisthesis, site unspecified: Secondary | ICD-10-CM | POA: Diagnosis not present

## 2024-05-17 DIAGNOSIS — R7303 Prediabetes: Secondary | ICD-10-CM

## 2024-05-17 DIAGNOSIS — E7800 Pure hypercholesterolemia, unspecified: Secondary | ICD-10-CM | POA: Diagnosis not present

## 2024-05-17 DIAGNOSIS — Z6841 Body Mass Index (BMI) 40.0 and over, adult: Secondary | ICD-10-CM

## 2024-05-17 DIAGNOSIS — I2583 Coronary atherosclerosis due to lipid rich plaque: Secondary | ICD-10-CM

## 2024-05-17 DIAGNOSIS — E559 Vitamin D deficiency, unspecified: Secondary | ICD-10-CM

## 2024-05-17 DIAGNOSIS — I2510 Atherosclerotic heart disease of native coronary artery without angina pectoris: Secondary | ICD-10-CM | POA: Diagnosis not present

## 2024-05-17 MED ORDER — METFORMIN HCL 500 MG PO TABS: 500.0000 mg | ORAL_TABLET | Freq: Every day | ORAL | 1 refills | Status: DC

## 2024-05-17 NOTE — Progress Notes (Unsigned)
 SUBJECTIVE: Discussed the use of AI scribe software for clinical note transcription with the patient, who gave verbal consent to proceed.  Chief Complaint: Obesity  Interim History: She has maintained her weight since last visit.  Down 18 lbs overall TBW loss of 5.3%  Elizabeth Mcclure is here to discuss her progress with her obesity treatment plan. She is on the Category 2 Plan and states she is following her eating plan approximately 90 % of the time. She states she is exercising walking/stretching 20 minutes 3 times per week.  The patient is a 58 year old with obesity, prediabetes, and severe degenerative spondylolisthesis who presents for follow-up of her obesity treatment plan.  She has maintained an 18-pound weight loss since her last visit but has not lost additional weight. She is currently taking metformin but missed two doses and requires a refill. She has not started Wegovy due to pending insurance authorization and experiences increased hunger over the past week.  She experiences significant back pain, particularly in the mornings, which improves after a few hours. Cold weather exacerbates her symptoms. She describes bilateral leg pain, with the pain shifting from the right to the left side. She has difficulty standing for long periods and performing tasks that require lifting with both hands. She uses lidocaine  patches and Advil at night to manage pain and improve sleep.  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 She experiences tingling in her legs and feet. An MRI confirmed these findings, and she is awaiting a follow-up appointment with a back specialist in December.  She has a history of prediabetes with insulin  resistance, hypercholesterolemia, and a coronary calcium score of 175 indicating nonobstructive coronary artery disease. She takes  vitamin D  supplements and reports no issues with metformin.  She works in a physically demanding job, standing for seven hours and moving groceries, which exacerbates her back pain. She lives alone and manages her household independently. She plans to cook a traditional Thanksgiving dinner and is mindful of her dietary choices, using smaller plates and avoiding sweets. OBJECTIVE: Visit Diagnoses: Problem List Items Addressed This Visit     Degenerative spondylolisthesis   Morbid obesity (HCC)   Relevant Medications   metFORMIN (GLUCOPHAGE) 500 MG tablet   Other Visit Diagnoses       Prediabetes    -  Primary   Relevant Medications   metFORMIN (GLUCOPHAGE) 500 MG tablet     Insulin  resistance         Pure hypercholesterolemia         Coronary artery disease due to lipid rich plaque- Coronary Ca++ score of 175- non obstructive disease         Vitamin D  deficiency         Obesity with prediabetes and insulin  resistance Obesity with prediabetes and insulin  resistance. Total weight loss of 18 pounds lost. Increased hunger over past few weeks.   Awaiting insurance appeal for [facility] coverage due to coronary calcium score of 175. Metformin taken with two missed days. No adverse effects from metformin reported. - Refilled metformin prescription. - Continue to await insurance appeal for [facility] coverage. - Encouraged dietary modifications, including portion control and balanced meals.  Nonobstructive coronary artery disease (coronary calcium score 175)/hyperlipidemia Nonobstructive coronary artery disease with a coronary calcium score of 175. Awaiting insurance appeal for Wegovy coverage due to coronary risk. Last lipids Lab Results  Component Value  Date   CHOL 209 (H) 03/15/2024   HDL 73 03/15/2024   LDLCALC 124 (H) 03/15/2024   TRIG 65 03/15/2024   CHOLHDL CANCELED 03/09/2024  Continue to work on nutrition plan -decreasing simple carbohydrates, increasing lean proteins, decreasing  saturated fats and cholesterol , avoiding trans fats and exercise as able to promote weight loss, improve lipids and decrease cardiovascular risks. - Continue to await insurance appeal for [facility] coverage.   Prediabetes/Insulin  resistance Her HOMA-IR is 7.9 which is severely elevated. Optimal level < 1.9. This is complex condition associated with genetics, ectopic fat and lifestyle factors. Insulin  resistance may result in weight gain, abnormal cravings (particularly for carbs) and fatigue. This may result in additional weight gain and lead to pre-diabetes and diabetes if untreated.   Lab Results  Component Value Date   HGBA1C 5.5 03/15/2024   Lab Results  Component Value Date   INSULIN  28.5 (H) 03/15/2024   Lab Results  Component Value Date   GLUCOSE 112 (H) 03/15/2024   GLUCOSE 86 01/12/2015    We reviewed treatment options which include losing 7 to 10% of body weight, increasing physical activity to a 150 minutes a week of moderate intensity.She may also be a candidate for pharmacoprophylaxis with metformin or other incretin mimetic like wegovy.  Plan: Continue working on nutrition plan to decrease simple carbohydrates, increase lean proteins and exercise to promote weight loss, improve glycemic control and prevent progression to Type 2 diabetes.  Continue/refill metformin 500 mg daily   Vitamin D  deficiency Vitamin D  deficiency. Currently taking Ergocalciferol  50,000 units once weekly.  Last vitamin D  Lab Results  Component Value Date   VD25OH 35.9 03/15/2024   Low vitamin D  levels can be associated with adiposity and may result in leptin resistance and weight gain. Also associated with fatigue.  Currently on vitamin D  supplementation without any adverse effects such as nausea, vomiting or muscle weakness.  Continue Ergocalciferol  50,000 units once weekly.    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 Pain management consultation scheduled with Dr. Clorinda. MRI shows mildly progressive moderate multifactorial spinal stenosis with narrowing at L4-5 and possible extra foraminal L5 nerve root impingement. Symptoms include leg pain and tingling, with pain exacerbated by cold weather and physical activity. She prefers non-pharmacological management options. She has follow up with Dr. Louis scheduled.  She would like to avoid surgery if at all possible.  - Evaluation with Dr. Carilyn scheduled as well .  Vitals Temp: 97.8 F (36.6 C) BP: 118/81 Pulse Rate: 68 SpO2: 100 %   Anthropometric Measurements Height: 5' 6 (1.676 m) Weight: (!) 320 lb (145.2 kg) BMI (Calculated): 51.67 Weight at Last Visit: 320 lb Weight Lost Since Last Visit: 0 Weight Gained Since Last Visit: 0 Starting Weight: 338 lb Total Weight Loss (lbs): 18 lb (8.165 kg) Peak Weight: 400 lb   Body Composition  Body Fat %: 56.3 % Fat Mass (lbs): 180.2 lbs Muscle Mass (lbs): 132.8 lbs Total Body Water  (lbs): 103.8 lbs Visceral Fat Rating : 22   Other Clinical Data Fasting: No Labs: No Today's Visit #: 6 Starting Date: 03/09/24     ASSESSMENT AND PLAN:  Diet: Elizabeth Mcclure is currently in the action stage of change. As such, her goal is to continue with weight loss efforts. She has agreed to Category 2 Plan.  Exercise: Elizabeth Mcclure has been instructed to work up to a goal of 150  minutes of combined cardio and strengthening exercise per week for weight loss and overall health benefits.   Behavior Modification:  We discussed the following Behavioral Modification Strategies today: increasing lean protein intake, decreasing simple carbohydrates, increasing vegetables, increase H2O intake, increase high fiber foods, meal planning and cooking strategies, better snacking choices, holiday eating strategies, avoiding temptations, and planning for  success. We discussed various medication options to help Elizabeth Mcclure with her weight loss efforts and we both agreed to continue current treatment plan.  Return in about 3 weeks (around 06/07/2024).SABRA She was informed of the importance of frequent follow up visits to maximize her success with intensive lifestyle modifications for her multiple health conditions.  Attestation Statements:   Reviewed by clinician on day of visit: allergies, medications, problem list, medical history, surgical history, family history, social history, and previous encounter notes.   Time spent on visit including pre-visit chart review and post-visit care and charting was 31 minutes.    Trinda Harlacher, PA-C

## 2024-05-18 ENCOUNTER — Telehealth (INDEPENDENT_AMBULATORY_CARE_PROVIDER_SITE_OTHER): Payer: Self-pay | Admitting: Physician Assistant

## 2024-05-18 NOTE — Telephone Encounter (Signed)
 Good morning!  She says that her insurance requires the office to file the appeal through [facility] for her wegovy that was denied and that she can not make the appeal as a patient. She is asking that we rectify.  Thanks!

## 2024-05-19 ENCOUNTER — Encounter (INDEPENDENT_AMBULATORY_CARE_PROVIDER_SITE_OTHER): Payer: Self-pay | Admitting: *Deleted

## 2024-05-19 NOTE — Telephone Encounter (Signed)
 Sent patient a [employer].

## 2024-05-24 DIAGNOSIS — Z299 Encounter for prophylactic measures, unspecified: Secondary | ICD-10-CM | POA: Diagnosis not present

## 2024-05-24 DIAGNOSIS — Z0000 Encounter for general adult medical examination without abnormal findings: Secondary | ICD-10-CM | POA: Diagnosis not present

## 2024-05-24 DIAGNOSIS — F339 Major depressive disorder, recurrent, unspecified: Secondary | ICD-10-CM | POA: Diagnosis not present

## 2024-05-24 DIAGNOSIS — F419 Anxiety disorder, unspecified: Secondary | ICD-10-CM | POA: Diagnosis not present

## 2024-05-24 DIAGNOSIS — K219 Gastro-esophageal reflux disease without esophagitis: Secondary | ICD-10-CM | POA: Diagnosis not present

## 2024-06-07 NOTE — Progress Notes (Unsigned)
 SUBJECTIVE: Discussed the use of AI scribe software for clinical note transcription with the patient, who gave verbal consent to proceed.  Chief Complaint: Obesity  Interim History: She has maintained her weight since last visit.  Down 18 lbs overall TBW loss of 5.3%  Elizabeth Mcclure is here to discuss her progress with her obesity treatment plan. She is on the Category 2 Plan and states she is following her eating plan approximately 90 % of the time. She states she is exercising walking/stretching 20 minutes 3 times per week.  The patient is a 58 year old with obesity, prediabetes, and severe degenerative spondylolisthesis who presents for follow-up of her obesity treatment plan.  She has maintained an 18-pound weight loss since her last visit but has not lost additional weight. She is currently taking metformin but missed two doses and requires a refill. She has not started Wegovy due to pending insurance authorization and experiences increased hunger over the past week.  She experiences significant back pain, particularly in the mornings, which improves after a few hours. Cold weather exacerbates her symptoms. She describes bilateral leg pain, with the pain shifting from the right to the left side. She has difficulty standing for long periods and performing tasks that require lifting with both hands. She uses lidocaine  patches and Advil at night to manage pain and improve sleep.  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 She experiences tingling in her legs and feet. An MRI confirmed these findings, and she is awaiting a follow-up appointment with a back specialist in December.  She has a history of prediabetes with insulin  resistance, hypercholesterolemia, and a coronary calcium score of 175 indicating nonobstructive coronary artery disease. She takes  vitamin D  supplements and reports no issues with metformin.  She works in a physically demanding job, standing for seven hours and moving groceries, which exacerbates her back pain. She lives alone and manages her household independently. She plans to cook a traditional Thanksgiving dinner and is mindful of her dietary choices, using smaller plates and avoiding sweets. OBJECTIVE: Visit Diagnoses: Problem List Items Addressed This Visit     Degenerative spondylolisthesis   Morbid obesity (HCC)   Relevant Medications   metFORMIN (GLUCOPHAGE) 500 MG tablet   Other Visit Diagnoses       Prediabetes    -  Primary   Relevant Medications   metFORMIN (GLUCOPHAGE) 500 MG tablet     Insulin  resistance         Pure hypercholesterolemia         Coronary artery disease due to lipid rich plaque- Coronary Ca++ score of 175- non obstructive disease         Vitamin D  deficiency         Obesity with prediabetes and insulin  resistance Obesity with prediabetes and insulin  resistance. Total weight loss of 18 pounds lost. Increased hunger over past few weeks.   Awaiting insurance appeal for [facility] coverage due to coronary calcium score of 175. Metformin taken with two missed days. No adverse effects from metformin reported. - Refilled metformin prescription. - Continue to await insurance appeal for [facility] coverage. - Encouraged dietary modifications, including portion control and balanced meals.  Nonobstructive coronary artery disease (coronary calcium score 175)/hyperlipidemia Nonobstructive coronary artery disease with a coronary calcium score of 175. Awaiting insurance appeal for Wegovy coverage due to coronary risk. Last lipids Lab Results  Component Value  Date   CHOL 209 (H) 03/15/2024   HDL 73 03/15/2024   LDLCALC 124 (H) 03/15/2024   TRIG 65 03/15/2024   CHOLHDL CANCELED 03/09/2024  Continue to work on nutrition plan -decreasing simple carbohydrates, increasing lean proteins, decreasing  saturated fats and cholesterol , avoiding trans fats and exercise as able to promote weight loss, improve lipids and decrease cardiovascular risks. - Continue to await insurance appeal for [facility] coverage.   Prediabetes/Insulin  resistance Her HOMA-IR is 7.9 which is severely elevated. Optimal level < 1.9. This is complex condition associated with genetics, ectopic fat and lifestyle factors. Insulin  resistance may result in weight gain, abnormal cravings (particularly for carbs) and fatigue. This may result in additional weight gain and lead to pre-diabetes and diabetes if untreated.   Lab Results  Component Value Date   HGBA1C 5.5 03/15/2024   Lab Results  Component Value Date   INSULIN  28.5 (H) 03/15/2024   Lab Results  Component Value Date   GLUCOSE 112 (H) 03/15/2024   GLUCOSE 86 01/12/2015    We reviewed treatment options which include losing 7 to 10% of body weight, increasing physical activity to a 150 minutes a week of moderate intensity.She may also be a candidate for pharmacoprophylaxis with metformin or other incretin mimetic like wegovy.  Plan: Continue working on nutrition plan to decrease simple carbohydrates, increase lean proteins and exercise to promote weight loss, improve glycemic control and prevent progression to Type 2 diabetes.  Continue/refill metformin 500 mg daily   Vitamin D  deficiency Vitamin D  deficiency. Currently taking Ergocalciferol  50,000 units once weekly.  Last vitamin D  Lab Results  Component Value Date   VD25OH 35.9 03/15/2024   Low vitamin D  levels can be associated with adiposity and may result in leptin resistance and weight gain. Also associated with fatigue.  Currently on vitamin D  supplementation without any adverse effects such as nausea, vomiting or muscle weakness.  Continue Ergocalciferol  50,000 units once weekly.    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 Pain management consultation scheduled with Dr. Clorinda. MRI shows mildly progressive moderate multifactorial spinal stenosis with narrowing at L4-5 and possible extra foraminal L5 nerve root impingement. Symptoms include leg pain and tingling, with pain exacerbated by cold weather and physical activity. She prefers non-pharmacological management options. She has follow up with Dr. Louis scheduled.  She would like to avoid surgery if at all possible.  - Evaluation with Dr. Carilyn scheduled as well .  Vitals Temp: 97.8 F (36.6 C) BP: 118/81 Pulse Rate: 68 SpO2: 100 %   Anthropometric Measurements Height: 5' 6 (1.676 m) Weight: (!) 320 lb (145.2 kg) BMI (Calculated): 51.67 Weight at Last Visit: 320 lb Weight Lost Since Last Visit: 0 Weight Gained Since Last Visit: 0 Starting Weight: 338 lb Total Weight Loss (lbs): 18 lb (8.165 kg) Peak Weight: 400 lb   Body Composition  Body Fat %: 56.3 % Fat Mass (lbs): 180.2 lbs Muscle Mass (lbs): 132.8 lbs Total Body Water  (lbs): 103.8 lbs Visceral Fat Rating : 22   Other Clinical Data Fasting: No Labs: No Today's Visit #: 6 Starting Date: 03/09/24     ASSESSMENT AND PLAN:  Diet: Elizabeth Mcclure is currently in the action stage of change. As such, her goal is to continue with weight loss efforts. She has agreed to Category 2 Plan.  Exercise: Elizabeth Mcclure has been instructed to work up to a goal of 150  minutes of combined cardio and strengthening exercise per week for weight loss and overall health benefits.   Behavior Modification:  We discussed the following Behavioral Modification Strategies today: increasing lean protein intake, decreasing simple carbohydrates, increasing vegetables, increase H2O intake, increase high fiber foods, meal planning and cooking strategies, better snacking choices, holiday eating strategies, avoiding temptations, and planning for  success. We discussed various medication options to help Elizabeth Mcclure with her weight loss efforts and we both agreed to continue current treatment plan.  Return in about 3 weeks (around 06/07/2024).Elizabeth Mcclure She was informed of the importance of frequent follow up visits to maximize her success with intensive lifestyle modifications for her multiple health conditions.  Attestation Statements:   Reviewed by clinician on day of visit: allergies, medications, problem list, medical history, surgical history, family history, social history, and previous encounter notes.   Time spent on visit including pre-visit chart review and post-visit care and charting was 31 minutes.    Trinda Harlacher, PA-C

## 2024-06-08 ENCOUNTER — Ambulatory Visit (INDEPENDENT_AMBULATORY_CARE_PROVIDER_SITE_OTHER): Admitting: Physician Assistant

## 2024-06-08 ENCOUNTER — Encounter (INDEPENDENT_AMBULATORY_CARE_PROVIDER_SITE_OTHER): Payer: Self-pay | Admitting: Physician Assistant

## 2024-06-08 VITALS — BP 141/81 | HR 70 | Temp 97.8°F | Ht 66.0 in | Wt 322.4 lb

## 2024-06-08 DIAGNOSIS — Z6841 Body Mass Index (BMI) 40.0 and over, adult: Secondary | ICD-10-CM

## 2024-06-08 DIAGNOSIS — M4310 Spondylolisthesis, site unspecified: Secondary | ICD-10-CM | POA: Diagnosis not present

## 2024-06-08 DIAGNOSIS — E559 Vitamin D deficiency, unspecified: Secondary | ICD-10-CM | POA: Diagnosis not present

## 2024-06-08 DIAGNOSIS — R7303 Prediabetes: Secondary | ICD-10-CM

## 2024-06-08 DIAGNOSIS — I2510 Atherosclerotic heart disease of native coronary artery without angina pectoris: Secondary | ICD-10-CM

## 2024-06-08 DIAGNOSIS — I2583 Coronary atherosclerosis due to lipid rich plaque: Secondary | ICD-10-CM | POA: Diagnosis not present

## 2024-06-08 DIAGNOSIS — E88819 Insulin resistance, unspecified: Secondary | ICD-10-CM

## 2024-06-08 DIAGNOSIS — E7800 Pure hypercholesterolemia, unspecified: Secondary | ICD-10-CM | POA: Diagnosis not present

## 2024-06-08 MED ORDER — VITAMIN D (ERGOCALCIFEROL) 1.25 MG (50000 UNIT) PO CAPS: 50000.0000 [IU] | ORAL_CAPSULE | ORAL | 0 refills | Status: DC

## 2024-06-08 MED ORDER — METFORMIN HCL 500 MG PO TABS: 500.0000 mg | ORAL_TABLET | Freq: Every day | ORAL | 1 refills | Status: AC

## 2024-06-08 MED ORDER — WEGOVY 0.25 MG/0.5ML ~~LOC~~ SOAJ: 0.2500 mg | SUBCUTANEOUS | 0 refills | Status: DC

## 2024-06-22 ENCOUNTER — Encounter (INDEPENDENT_AMBULATORY_CARE_PROVIDER_SITE_OTHER): Payer: Self-pay

## 2024-06-22 ENCOUNTER — Ambulatory Visit (INDEPENDENT_AMBULATORY_CARE_PROVIDER_SITE_OTHER): Admitting: Physician Assistant

## 2024-06-24 ENCOUNTER — Telehealth: Payer: Self-pay

## 2024-06-24 ENCOUNTER — Encounter: Payer: Self-pay | Admitting: Physical Medicine & Rehabilitation

## 2024-06-24 ENCOUNTER — Encounter: Attending: Physical Medicine & Rehabilitation | Admitting: Physical Medicine & Rehabilitation

## 2024-06-24 VITALS — BP 135/82 | HR 75 | Ht 66.0 in | Wt 326.0 lb

## 2024-06-24 DIAGNOSIS — M48062 Spinal stenosis, lumbar region with neurogenic claudication: Secondary | ICD-10-CM | POA: Diagnosis not present

## 2024-06-24 NOTE — Telephone Encounter (Signed)
 Pre-procedure has been discussed over the phone with Donzell Don. Patient request pre-procedure medication. Rx phone in to [facility]. Patient has been advised she must have a driver the day of the procedure.

## 2024-06-24 NOTE — Patient Instructions (Signed)
" °  VISIT SUMMARY: Today, we discussed your worsening low back pain and left leg pain, which have been significantly affecting your daily activities. We reviewed your MRI results and discussed various non-surgical treatment options to help manage your symptoms.  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 -We reviewed your MRI findings. -We discussed epidural steroid injections targeting L3-4 and L4-5 as an initial intervention for your leg pain. -We started the insurance approval process for the injections. -You may have these injections up to three or four times per year, with about three-month intervals. -Continue your home exercise program, including knee hugs performed in bed. -Keep working on your [employer] with your weight management physician. -Physical therapy may be required by insurance before the injections; continue your home exercises for now. -Further therapy may be considered after the injections if needed.  DEGENERATIVE DISC DISEASE OF LUMBAR SPINE: You have chronic degenerative disc disease in your lower back, contributing to your pain and limitations. -We reviewed your MRI findings and discussed how disc degeneration is contributing to your symptoms. -We will continue with non-surgical management, including injections and home exercises. -Surgery will only be considered if your symptoms become severe and do not respond to conservative measures.  OSTEOARTHRITIS OF RIGHT KNEE, STATUS POST TOTAL KNEE REPLACEMENT: You have some residual pain in your right knee after your total knee replacement surgery. -We discussed the option of viscosupplementation (gel injections) if your symptoms worsen. -Continue with conservative management for now.  OSTEOARTHRITIS OF LEFT KNEE WITH POSSIBLE MENISCAL TEAR: You have osteoarthritis in your  left knee, and there may be a meniscal tear causing pain and limitations. -Surgical intervention may be considered in the future if your symptoms worsen. -Continue with conservative management for now.  ROTATOR CUFF DEGENERATION OF RIGHT SHOULDER: You have right shoulder pain likely due to rotator cuff degeneration, which is common with age and repetitive use. -Continue with your home exercises and avoid activities that aggravate your symptoms, especially overhead movements like painting.                      Contains text generated by [employer].                                 Contains text generated by [employer].   "

## 2024-06-24 NOTE — Progress Notes (Signed)
 "  Subjective:    Patient ID: Elizabeth Mcclure, female    DOB: April 29, 1966, 58 y.o.   MRN: [id number]  HPI  Discussed the use of AI scribe software for clinical note transcription with the patient, who gave verbal consent to proceed.  History of Present Illness Elizabeth Mcclure is a 57 year old female with lumbar spinal stenosis, degenerative disc disease, and prior right total knee replacement who presents for evaluation of worsening low back pain with left lower extremity radiculopathy.  She has experienced chronic low back pain for several years, with progressive worsening. The pain is localized to the lower lumbar region and coccyx, radiating down the entire left lower extremity. The left leg pain is described as stabbing, burning, and tingling, with intermittent severe episodes, particularly in the mornings, requiring 30-60 minutes to become tolerable. Sitting increases pressure and causes a sensation of impaired circulation in the left leg, while walking and stair climbing are limited due to pain. Laying flat is painful, and she is unable to perform exercises on hard surfaces.  Her symptoms acutely worsened several months ago following overhead painting, resulting in severe pain and inability to ambulate for approximately three weeks. She alternates between sitting and standing to manage symptoms. Outdoor activities, including visiting waterfalls and swimming, are significantly limited by pain.  She has undergone prior interventions for spinal symptoms, including epidural steroid injections in October 2024, which provided temporary relief for approximately one month. She was advised not to pursue additional injections at that time. Bilateral hip injections in 2016 or 2017 provided significant relief for about a year. She has not had back surgery and wishes to avoid surgical intervention. She performs home physical therapy exercises but has not participated in formal physical therapy due to financial  constraints. Stationary biking is intolerable due to lower spine pain. She has not tried acupuncture.  She is status post right total knee replacement, with residual lateral knee pain but improved function compared to preoperative status. She reports that her left knee is more painful than the right and suspects she may have a torn meniscus. She also experiences left shoulder pain with overhead activities and certain movements, with pulling sensations radiating across the posterior neck. She reports that she is left handed but frequently uses her right hand for work and activities. She has a hammer toe on the right foot and pain from the little toe curling under.  She has experienced weight gain due to decreased mobility from pain, after previously losing significant weight following knee surgery in 2017. She is currently working with a weight management physician and is actively attempting to lose weight to reduce pressure on her back.    Study Result  Narrative & Impression  CLINICAL DATA:  Chronic left-sided low back pain radiating to the left buttock and leg for 4 years. No known injury or prior relevant surgery.   EXAM: MRI LUMBAR SPINE WITHOUT CONTRAST   TECHNIQUE: Multiplanar, multisequence MR imaging of the lumbar spine was performed. No intravenous contrast was administered.   COMPARISON:  Lumbar spine radiographs 01/21/2023. Lumbar MRI 01/07/2023.   FINDINGS: Segmentation: Transitional lumbosacral anatomy. In keeping with the numbering applied to previous MRI, the transitional segment is assigned L5. There is a left-sided lumbosacral assimilation joint.   Alignment: Stable mild convex left scoliosis. Minimal degenerative anterolisthesis at L4-5.   Vertebrae: No worrisome osseous lesion, acute fracture or pars defect.   Conus medullaris: Extends to the L1 level. The conus and cauda equina appear  normal.   Paraspinal and other soft tissues: No significant  paraspinal findings.   Disc levels:   Sagittal images demonstrate no significant disc space findings within the visualized lower thoracic spine.   L1-2: Preserved disc height and hydration. Mild bilateral facet hypertrophy. No spinal stenosis or significant foraminal narrowing.   L2-3: Mild loss of disc height with mild disc bulging, facet and ligamentous hypertrophy. No spinal stenosis or significant foraminal narrowing.   L3-4: Similar loss of disc height with annular disc bulging and a broad-based disc protrusion in the left subarticular and extraforaminal zones. Moderate facet and ligamentous hypertrophy. Mildly progressive multifactorial spinal stenosis, now moderate. There is asymmetric narrowing of the left lateral recess and possible left L4 nerve root encroachment in the canal. Mild foraminal narrowing bilaterally appears unchanged.   L4-5: Similar loss of disc height with annular disc bulging and endplate osteophytes asymmetric to the right. Moderate to severe facet hypertrophy. No significant change in mild spinal stenosis with asymmetric narrowing of the left lateral recess. Stable mild foraminal narrowing bilaterally.   L5-S1: As numbered, this is a transitional disc space level with a vestigial hydrated disc. No spinal stenosis or significant foraminal narrowing. As before, possible extraforaminal left L5 nerve root impingement by osteophytes associated with lumbosacral assimilation joint.   IMPRESSION: 1. Transitional lumbosacral anatomy. In keeping with the numbering applied to previous MRI, the transitional segment is assigned L5. 2. Mildly progressive moderate multifactorial spinal stenosis at L3-4 with asymmetric narrowing of the left lateral recess and possible left L4 nerve root encroachment. 3. Stable mild multifactorial spinal stenosis at L4-5 with asymmetric narrowing of the left lateral recess. 4. Stable possible extraforaminal left L5 nerve root  impingement at L5-S1 by osteophytes associated with lumbosacral assimilation joint. 5. No acute findings.     Electronically Signed   By: Elsie Perone M.D.   On: 05/03/2024 11:58   Pain Inventory Average Pain 7 Pain Right Now 7 My pain is intermittent, constant, sharp, burning, dull, tingling, and aching  In the last 24 hours, has pain interfered with the following? General activity 9 Relation with others 7 Enjoyment of life 10 What TIME of day is your pain at its worst? morning  Sleep (in general) Fair  Pain is worse with: walking, bending, sitting, standing, and some activites Pain improves with: rest, pacing activities, medication, and heat Relief from Meds: 4  how many minutes can you walk? 20-30 ability to climb steps?  no do you drive?  yes  employed # of hrs/week 12 disabled: date disabled 2019 I need assistance with the following:  household duties Do you have any goals in this area?  yes  bladder control problems weakness numbness tingling trouble walking spasms depression anxiety     Family History  Problem Relation Age of Onset   Cancer Mother    Colon cancer Mother    Hypertension Mother    Depression Mother    Anxiety disorder Mother    Stroke Father    Hyperlipidemia Father    Arthritis Father    Hypertension Father    Heart disease Father    Alcoholism Father    Cancer Sister    Heart disease Sister    Heart disease Brother    Social History   Socioeconomic History   Marital status: Married    Spouse name: Not on file   Number of children: Not on file   Years of education: Not on file   Highest education level: Not on  file  Occupational History   Not on file  Tobacco Use   Smoking status: Former    Current packs/day: 0.25    Average packs/day: 0.3 packs/day for 3.0 years (0.8 ttl pk-yrs)    Types: Cigarettes   Smokeless tobacco: Never   Tobacco comments:    Quit smoking x 13 years  Vaping Use   Vaping status: Never  Used  Substance and Sexual Activity   Alcohol  use: Yes    Comment: 2-3 drinks a year   Drug use: No   Sexual activity: Not on file  Other Topics Concern   Not on file  Social History Narrative   Not on file   Social Drivers of Health   Tobacco Use: Medium Risk (06/08/2024)   Patient History    Smoking Tobacco Use: Former    Smokeless Tobacco Use: Never    Passive Exposure: Not on Actuary Strain: Not on file  Food Insecurity: Not on file  Transportation Needs: Not on file  Physical Activity: Not on file  Stress: Not on file  Social Connections: Not on file  Depression (PHQ2-9): Medium Risk (06/24/2024)   Depression (PHQ2-9)    PHQ-2 Score: 8  Alcohol  Screen: Not on file  Housing: Not on file  Utilities: Not on file  Health Literacy: Low Risk (05/13/2022)   Received from [facility] Literacy    How often do you need to have someone help you when you read instructions, pamphlets, or other written material from your doctor or pharmacy?: Never   Past Surgical History:  Procedure Laterality Date   ablation right great sapheous vein      02/09/2008   ANTERIOR CERVICAL DECOMP/DISCECTOMY FUSION N/A 11/12/2018   Procedure: ANTERIOR CERVICAL DECOMPRESSION/DISCECTOMY FUSION CERVICAL FIVE- CERVICAL SIX, CERVICAL SIX- CERVICAL SEVEN;  Surgeon: Louis Shove, MD;  Location: [facility] OR;  Service: Neurosurgery;  Laterality: N/A;  ANTERIOR CERVICAL DECOMPRESSION/DISCECTOMY FUSION CERVICAL FIVE- CERVICAL SIX, CERVICAL SIX- CERVICAL SEVEN   CARPAL TUNNEL RELEASE Right    CHOLECYSTECTOMY     COLONOSCOPY  05/24/2012   Procedure: COLONOSCOPY;  Surgeon: Margo LITTIE Haddock, MD;  Location: [facility];  Service: Endoscopy;  Laterality: N/A;  9:30   DILATION AND CURETTAGE OF UTERUS     x4   JOINT REPLACEMENT  2019   knee   left knee arthroscopy     stab phlebectomy      02/2008   TOTAL KNEE ARTHROPLASTY Left 01/18/2015   Procedure: LEFT TOTAL KNEE ARTHROPLASTY;  Surgeon:  Redell Shoals, MD;  Location: [facility] ORS;  Service: Orthopedics;  Laterality: Left;   vein closure     X2, needs vein stripping   VEIN LIGATION AND STRIPPING Right 07/25/2016   Procedure: LIGATION AND STRIPPING GREATER SAPHENOUS VEIN; 10-20 STAB PHLEBECTOMY;  Surgeon: Krystal JULIANNA Doing, MD;  Location: [facility] OR;  Service: Vascular;  Laterality: Right;   venous duplex      hx of    Past Medical History:  Diagnosis Date   Anxiety    Arthritis    Back pain    Bulging lumbar disc    Carpal tunnel syndrome    Chest pain    Constipation    Depression    Edema of both lower extremities    Gallbladder problem    GERD (gastroesophageal reflux disease)    History of pneumonia    Joint pain    Osteoarthritis    Prediabetes    RA (rheumatoid arthritis) (HCC)  SOBOE (shortness of breath on exertion)    Varicose veins of right leg with edema    pt states has had vein closure twice in this leg saw Dr Early 5 years ago pt states has not seen since edema decreases with elevation    Vitamin D  deficiency    Ht 5' 6 (1.676 m)   Wt (!) 326 lb (147.9 kg)   LMP 10/06/2018   BMI 52.62 kg/m   Opioid Risk Score:   Fall Risk Score:  `1  Depression screen [facility] 2/9     06/24/2024   12:27 PM 03/09/2024    7:47 AM  Depression screen PHQ 2/9  Decreased Interest 2 1  Down, Depressed, Hopeless 1 1  PHQ - 2 Score 3 2  Altered sleeping 1 1  Tired, decreased energy 2 2  Change in appetite 0 1  Feeling bad or failure about yourself  1 0  Trouble concentrating 1 0  Moving slowly or fidgety/restless 0 0  Suicidal thoughts 0 0  PHQ-9 Score 8 6   Difficult doing work/chores  Very difficult     Data saved with a previous flowsheet row definition    Review of Systems  Genitourinary:        Urine urgency   Musculoskeletal:  Positive for back pain and gait problem.       Pain in the left shoulder, left upper arm, right knee, left hip down to left foot, right hip pain  spasms  Neurological:  Positive for  weakness.       Tingling  Psychiatric/Behavioral:         Depression, Anxiety  All other systems reviewed and are negative.      Objective:   Physical Exam Constitutional:      Appearance: She is obese.  HENT:     Head: Normocephalic and atraumatic.  Musculoskeletal:     Right lower leg: No edema.     Left lower leg: No edema.  Neurological:     Mental Status: She is alert and oriented to person, place, and time.     Gait: Gait normal.  Psychiatric:        Mood and Affect: Mood normal.        Behavior: Behavior normal.   Lumbar range of motion has pain with lumbar flexion has about 50% lumbar flexion range she also has 25% range with lumbar extension With twisting she also has mild pain in the lower lumbar region Negative straight leg raising bilaterally Negative slump test bilaterally Sensation intact to light touch bilateral L4, intact to light touch right L5 and right S1 reduced to light touch left L5 and absent to light touch left S1 Ambulates without assistive device no evidence toe drag or knee stability         Assessment & Plan:  Assessment and Plan Assessment & Plan Lumbar spinal stenosis with neurogenic claudication and radiculopathy Progressive lumbar spinal stenosis with neurogenic claudication and radiculopathy, predominantly affecting the left leg with severe, function-limiting neuropathic pain. MRI shows moderate multifactorial stenosis at L3-4 with possible left L4 nerve root encroachment and similar findings at L5-S1. Symptoms worsening, most severe in the morning. Prefers to avoid surgery. - Reviewed lumbar MRI findings. - Discussed epidural steroid injections targeting L3-4 and L4-5 as initial intervention for leg pain, explaining injections may be performed at one or two levels and typically provide temporary relief. - [employer] approval process for epidural steroid injections. - Explained injections may be repeated up  to three or four times  per year, with approximately three-month intervals. - Recommended continuation of home exercise program, including knee hugs performed in bed. - Advised ongoing weight management in coordination with her weight management physician. - Discussed that physical therapy may be required by insurance prior to injections; she has been performing home exercises due to financial constraints. - Advised that further therapy may be considered after injections if needed.  Degenerative disc disease of lumbar spine Chronic degenerative disc disease of the lumbar spine contributing to pain and functional limitations. MRI shows disc bulging and degeneration, particularly at L3-4 and L4-5, with both disc pathology and arthritis contributing to symptoms. Prefers non-surgical management. The patient is good candidate for left L3-L4 and left L4-L5 transforaminal epidural steroid injections and fluoroscopic guidance.  She is not on anticoagulant medications she does not have a contrast allergy.  She will need a driver for the day of the injection - Reviewed MRI findings and discussed the role of disc degeneration in her symptoms. - Advised non-surgical management, including injections and home exercise. - Discussed that surgical intervention will be deferred unless symptoms become severe and refractory to conservative measures.  Osteoarthritis of right knee, status post total knee replacement Status post total knee replacement of the right knee, with residual lateral knee pain. Symptoms managed conservatively. - Discussed availability of viscosupplementation (gel injections) if symptoms worsen. - Advised continued conservative management.  Osteoarthritis of left knee with possible meniscal tear Osteoarthritis of the left knee with possible meniscal tear, resulting in pain and functional limitations. Conservative management preferred. - Discussed that surgical intervention may be considered in the future if symptoms  worsen. - Advised continued conservative management.  Rotator cuff degeneration of right shoulder Right shoulder pain with clinical findings suggestive of rotator cuff degeneration, likely related to age and repetitive use. Symptoms aggravated by overhead activities.  - Discussed that rotator cuff degeneration is common in her age group and may be contributing to symptoms. - Advised continuation of home exercise and avoidance of aggravating activities, particularly overhead movements such as painting.    "

## 2024-06-27 MED ORDER — DIAZEPAM 10 MG PO TABS: 10.0000 mg | ORAL_TABLET | Freq: Once | ORAL | 0 refills | Status: AC

## 2024-07-13 ENCOUNTER — Ambulatory Visit (INDEPENDENT_AMBULATORY_CARE_PROVIDER_SITE_OTHER): Admitting: Family Medicine

## 2024-07-13 ENCOUNTER — Ambulatory Visit (INDEPENDENT_AMBULATORY_CARE_PROVIDER_SITE_OTHER): Admitting: Physician Assistant

## 2024-07-13 ENCOUNTER — Encounter (INDEPENDENT_AMBULATORY_CARE_PROVIDER_SITE_OTHER): Payer: Self-pay | Admitting: Family Medicine

## 2024-07-13 DIAGNOSIS — E669 Obesity, unspecified: Secondary | ICD-10-CM

## 2024-07-13 DIAGNOSIS — M4310 Spondylolisthesis, site unspecified: Secondary | ICD-10-CM | POA: Diagnosis not present

## 2024-07-13 DIAGNOSIS — Z6841 Body Mass Index (BMI) 40.0 and over, adult: Secondary | ICD-10-CM | POA: Diagnosis not present

## 2024-07-13 DIAGNOSIS — R7303 Prediabetes: Secondary | ICD-10-CM | POA: Diagnosis not present

## 2024-07-13 DIAGNOSIS — E559 Vitamin D deficiency, unspecified: Secondary | ICD-10-CM

## 2024-07-13 MED ORDER — VITAMIN D (ERGOCALCIFEROL) 1.25 MG (50000 UNIT) PO CAPS: 50000.0000 [IU] | ORAL_CAPSULE | ORAL | 0 refills | Status: AC

## 2024-07-13 MED ORDER — SEMAGLUTIDE-WEIGHT MANAGEMENT 0.25 MG/0.5ML ~~LOC~~ SOAJ: 0.2500 mg | SUBCUTANEOUS | 0 refills | Status: AC

## 2024-07-13 NOTE — Progress Notes (Signed)
 "  Barnie DOROTHA Jenkins, D.O.  ABFM, ABOM Specializing in Clinical Bariatric Medicine  Office located at: [address] [place], [place]  [address]      A) FOR THE CHRONIC DISEASE OF OBESITY:  Obesity beginning BMI 54.55 BMI 50.0-59.9, adult (HCC) Current BMI 52.16  Chief complaint: Obesity Elizabeth Mcclure is here to discuss her progress with her obesity treatment plan.   History of present illness / Interval history:  Elizabeth Mcclure is here today for her follow-up office visit.  Since last OV on 06/08/2024, pt is up 1 lb.   Has persistent back issues and is here today because she want to move more.    06/08/24 10:00 07/13/24 11:00   Body Fat % 57.3 % 57.3 %  Muscle Mass (lbs) 130.6 lbs 131 lbs  Fat Mass (lbs) 184.8 lbs 185.2 lbs  Visceral Fat Rating  22 23  Counseling done on how various foods will affect these numbers and how to maximize success   Total lbs lost to date: -15 lbs Total Fat Mass in lbs lost to date: -13.4 Total weight loss percentage to date: -4.44 %   Nutrition Therapy She is on the Category 2 Plan and states she is following her eating plan approximately 70 % of the time.   - Tracking Calories/Macros: no  - Eating More Whole Foods: yes  - Adequate Protein Intake: yes  - Adequate Water  Intake: no  - Skipping Meals: no  - Sleeping 7-9 Hours/ Night: yes   Nairi is currently in the action stage of change. As such, her goal is to continue weight management plan.  She has agreed to: switch to journaling 1450-1600 calories and 90++ grams of protein per day.   Physical Activity Pt is not exercising.   Krithika has been advised to work up to 300-450 minutes of moderate intensity aerobic activity a week and strengthening exercises 2-3 times per week for cardiovascular health, weight loss maintenance and preservation of muscle mass.  She has agreed to : Think about enjoyable ways to increase daily physical activity and overcoming barriers to exercise and  Increase physical activity in their day and reduce sedentary time (increase NEAT).   Behavioral Modifications Evidence-based interventions for health behavior change were utilized today including the discussion of  1) self monitoring techniques:  journaling 2) problem-solving barriers:  back pain 3) self care:  exercise 4) SMART goals for next OV:  none Regarding patient's less desirable eating habits and patterns, we employed the technique of small changes.   We discussed the following today: increasing lean protein intake to established goals, decreasing simple carbohydrates , work on tracking and journaling calories using tracking application, reading food labels , keeping healthy foods at home, practice mindfulness eating and understand the difference between hunger signals and cravings, and focusing on food with a 10:1 ratio of calories: grams of protein Additional resources provided today: Handout on protein content of various foods, Physician provided patient with handouts and personalized instruction on tracking and journaling using Apps (or how to handwrite in notebook) and using logs provided , and Handout on Daily Food Journaling Log   Medical Interventions/ Pharmacotherapy Previous Bariatric surgery: none Pharmacotherapy for weight loss: She is currently taking Metformin  250 mg once daily for medical weight loss.    We discussed various medication options to help Rosangelica with her weight loss efforts and we both agreed to : Start Wegovy  0.25 mg once weekly and self-pay for medication.   B) OBESITY RELATED  CONDITIONS ADDRESSED TODAY:   Prediabetes Assessment & Plan Lab Results  Component Value Date   HGBA1C 5.5 03/15/2024   HGBA1C CANCELED 03/09/2024   INSULIN  28.5 (H) 03/15/2024  Prescribed on Metformin  250 mg daily. Is not currently taking medication because she did not tolerate it well and felt it was making her more hungry. Her insurance appeal was not approved for [facility],  but since the price has decreased, she has agreed to self-pay.  Start Wegovy  0.25 mg once weekly.    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 Recent exacerbation of pain with new pain across the bottom of the spine and shoulder. Managing her pain with ibuprofen and tylenol . Continue current pain management regimen with ibuprofen and Tylenol  as needed. Her chronic back pain makes it difficult to exercise, recommend to look into aquatic therapy to help the pain.      Vitamin D  deficiency Assessment & Plan Lab Results  Component Value Date   VD25OH 35.9 03/15/2024  Managed with Ergo 50K units once weekly with good compliance and tolerance. Vit D levels are below goal at 35.9; optimal between 50-70. No acute concerns. Cont regimen(refill today). Will recheck levels as necessary.     Medications Discontinued During This Encounter  Medication Reason   Vitamin D , Ergocalciferol , (DRISDOL ) 1.25 MG (50000 UNIT) CAPS capsule Reorder     Meds ordered this encounter  Medications   semaglutide -weight management (WEGOVY ) 0.25 MG/0.5ML SOAJ SQ injection    Sig: Inject 0.25 mg into the skin once a week.    Dispense:  2 mL    Refill:  0   Vitamin D , Ergocalciferol , (DRISDOL ) 1.25 MG (50000 UNIT) CAPS capsule    Sig: Take 1 capsule (50,000 Units total) by mouth every 7 (seven) days.    Dispense:  4 capsule    Refill:  0      Follow up:   Return 07/27/2024 8:20 AM.  She was informed of the importance of frequent follow up visits to maximize her success with intensive lifestyle modifications for her multiple health conditions.   Weight Summary and Biometrics   Weight Lost Since Last Visit: 0lb  Weight Gained Since Last Visit: 1lb    Vitals Temp: 97.7 F (36.5 C) BP: 126/84 Pulse Rate: 72 SpO2: 98 %   Anthropometric Measurements Height: 5' 6 (1.676 m) Weight: (!) 323 lb (146.5 kg) BMI  (Calculated): 52.16 Weight at Last Visit: 322lb Weight Lost Since Last Visit: 0lb Weight Gained Since Last Visit: 1lb Starting Weight: 338lb Total Weight Loss (lbs): 15 lb (6.804 kg) Peak Weight: 400lb   Body Composition  Body Fat %: 57.3 % Fat Mass (lbs): 185.2 lbs Muscle Mass (lbs): 131 lbs Visceral Fat Rating : 23   Other Clinical Data Fasting: no Labs: no Today's Visit #: 8 Starting Date: 03/09/24    Objective:   PHYSICAL EXAM: Blood pressure 126/84, pulse 72, temperature 97.7 F (36.5 C), height 5' 6 (1.676 m), weight (!) 323 lb (146.5 kg), last menstrual period 10/06/2018, SpO2 98%. Body mass index is 52.13 kg/m.  General: she is overweight, cooperative and in no acute distress. PSYCH: Has normal mood, affect and thought process.   HEENT: EOMI, sclerae are anicteric. Lungs: Normal breathing effort, no conversational dyspnea. Extremities: Moves * 4 Neurologic: A and O * 3, good insight  DIAGNOSTIC DATA REVIEWED: BMET    Component Value Date/Time   NA 139 03/15/2024 0808   K 4.8 03/15/2024 9191  CL 102 03/15/2024 0808   CO2 21 03/15/2024 0808   GLUCOSE 112 (H) 03/15/2024 0808   GLUCOSE 138 (H) 11/11/2018 1348   BUN 17 03/15/2024 0808   CREATININE 0.80 03/15/2024 0808   CALCIUM 9.8 03/15/2024 0808   GFRNONAA >60 11/11/2018 1348   GFRAA >60 11/11/2018 1348   Lab Results  Component Value Date   HGBA1C 5.5 03/15/2024   HGBA1C CANCELED 03/09/2024   Lab Results  Component Value Date   INSULIN  28.5 (H) 03/15/2024   Lab Results  Component Value Date   TSH 0.583 03/15/2024   CBC    Component Value Date/Time   WBC 8.3 03/15/2024 0808   WBC 7.1 11/11/2018 1348   RBC 5.57 (H) 03/15/2024 0808   RBC 5.51 (H) 11/11/2018 1348   HGB 14.9 03/15/2024 0808   HCT 48.1 (H) 03/15/2024 0808   PLT 217 03/15/2024 0808   MCV 86 03/15/2024 0808   MCH 26.8 03/15/2024 0808   MCH 27.2 11/11/2018 1348   MCHC 31.0 (L) 03/15/2024 0808   MCHC 31.8 11/11/2018 1348    RDW 12.9 03/15/2024 0808   Iron Studies No results found for: IRON, TIBC, FERRITIN, IRONPCTSAT Lipid Panel     Component Value Date/Time   CHOL 209 (H) 03/15/2024 0808   TRIG 65 03/15/2024 0808   HDL 73 03/15/2024 0808   CHOLHDL CANCELED 03/09/2024 0854   LDLCALC 124 (H) 03/15/2024 0808   Hepatic Function Panel     Component Value Date/Time   PROT 7.5 03/15/2024 0808   ALBUMIN 4.5 03/15/2024 0808   AST 14 03/15/2024 0808   ALT 16 03/15/2024 0808   ALKPHOS 99 03/15/2024 0808   BILITOT 0.4 03/15/2024 0808      Component Value Date/Time   TSH 0.583 03/15/2024 0808   Nutritional Lab Results  Component Value Date   VD25OH 35.9 03/15/2024    Attestations:   LILLETTE Feliciano Mingle, acting as a stage manager for [employer], DO., have compiled all relevant documentation for today's office visit on behalf of Barnie Jenkins, DO, while in the presence of [employer], DO.    I have reviewed the above documentation for accuracy and completeness, and I agree with the above. Barnie JINNY Jenkins, D.O.  The 21st Century Cures Act was signed into law in 2016 which includes the topic of electronic health records.  This provides immediate access to information in MyChart.  This includes consultation notes, operative notes, office notes, lab results and pathology reports.  If you have any questions about what you read please let us  know at your next visit so we can discuss your concerns and take corrective action if need be.  We are right here with you.  "

## 2024-07-21 ENCOUNTER — Telehealth (INDEPENDENT_AMBULATORY_CARE_PROVIDER_SITE_OTHER): Payer: Self-pay

## 2024-07-21 NOTE — Telephone Encounter (Signed)
Information regarding your request There is an existing case within the [facility] environment that has the same patient, prescriber, and drug. This case must be finalized before proceeding with similar requests.

## 2024-07-27 ENCOUNTER — Ambulatory Visit (INDEPENDENT_AMBULATORY_CARE_PROVIDER_SITE_OTHER): Admitting: Family Medicine

## 2024-07-29 ENCOUNTER — Ambulatory Visit: Admitting: Urology

## 2024-08-05 ENCOUNTER — Encounter: Admitting: Physical Medicine & Rehabilitation

## 2024-08-10 ENCOUNTER — Ambulatory Visit (INDEPENDENT_AMBULATORY_CARE_PROVIDER_SITE_OTHER): Admitting: Family Medicine

## 2024-08-10 ENCOUNTER — Encounter (INDEPENDENT_AMBULATORY_CARE_PROVIDER_SITE_OTHER): Payer: Self-pay

## 2024-08-18 ENCOUNTER — Encounter: Admitting: Physical Medicine & Rehabilitation

## 2024-08-30 ENCOUNTER — Ambulatory Visit: Admitting: Podiatry
